# Patient Record
Sex: Female | Born: 1985
Health system: Southern US, Community
[De-identification: ages and names within clinical notes are randomized; demographics above are authoritative.]

## PROBLEM LIST (undated history)

## (undated) DIAGNOSIS — I491 Atrial premature depolarization: Secondary | ICD-10-CM

## (undated) DIAGNOSIS — E039 Hypothyroidism, unspecified: Secondary | ICD-10-CM

## (undated) DIAGNOSIS — R002 Palpitations: Secondary | ICD-10-CM

## (undated) HISTORY — DX: Hypothyroidism, unspecified: E03.9

## (undated) HISTORY — DX: Atrial premature depolarization: I49.1

## (undated) HISTORY — DX: Palpitations: R00.2

---

## 2004-01-07 ENCOUNTER — Emergency Department (HOSPITAL_COMMUNITY): Admission: EM | Admit: 2004-01-07 | Discharge: 2004-01-07 | Payer: Self-pay | Admitting: Emergency Medicine

## 2005-02-21 ENCOUNTER — Emergency Department (HOSPITAL_COMMUNITY): Admission: EM | Admit: 2005-02-21 | Discharge: 2005-02-22 | Payer: Self-pay | Admitting: Emergency Medicine

## 2008-03-28 ENCOUNTER — Inpatient Hospital Stay (HOSPITAL_COMMUNITY): Admission: AD | Admit: 2008-03-28 | Discharge: 2008-03-28 | Payer: Self-pay | Admitting: Obstetrics & Gynecology

## 2008-03-31 ENCOUNTER — Ambulatory Visit: Payer: Self-pay | Admitting: Obstetrics & Gynecology

## 2008-03-31 ENCOUNTER — Inpatient Hospital Stay (HOSPITAL_COMMUNITY): Admission: AD | Admit: 2008-03-31 | Discharge: 2008-04-03 | Payer: Self-pay | Admitting: Obstetrics

## 2008-04-02 ENCOUNTER — Encounter (INDEPENDENT_AMBULATORY_CARE_PROVIDER_SITE_OTHER): Payer: Self-pay | Admitting: Gastroenterology

## 2008-04-13 ENCOUNTER — Ambulatory Visit (HOSPITAL_COMMUNITY): Admission: RE | Admit: 2008-04-13 | Discharge: 2008-04-13 | Payer: Self-pay | Admitting: Obstetrics & Gynecology

## 2008-09-11 ENCOUNTER — Ambulatory Visit: Payer: Self-pay | Admitting: Obstetrics & Gynecology

## 2008-09-13 ENCOUNTER — Inpatient Hospital Stay (HOSPITAL_COMMUNITY): Admission: AD | Admit: 2008-09-13 | Discharge: 2008-09-17 | Payer: Self-pay | Admitting: Obstetrics and Gynecology

## 2008-09-13 ENCOUNTER — Ambulatory Visit: Payer: Self-pay | Admitting: Obstetrics & Gynecology

## 2008-09-14 ENCOUNTER — Encounter: Payer: Self-pay | Admitting: Obstetrics & Gynecology

## 2008-09-20 ENCOUNTER — Ambulatory Visit: Admission: RE | Admit: 2008-09-20 | Discharge: 2008-09-20 | Payer: Self-pay | Admitting: Obstetrics & Gynecology

## 2009-07-31 IMAGING — US US ABDOMEN COMPLETE
1 series · 14 of 25 positions shown · non-contrast
Comparison: None

CLINICAL DATA: Epigastric pain

COMPLETE ABDOMINAL ULTRASOUND
TECHNIQUE: Complete abdominal ultrasound examination was performed
including evaluation of the liver, gallbladder, bile ducts,
pancreas, kidneys, spleen, IVC, and abdominal aorta.

[Series 1: unknown · 0.34mm/px · 14 of 57 slices shown]
[im 1/57]
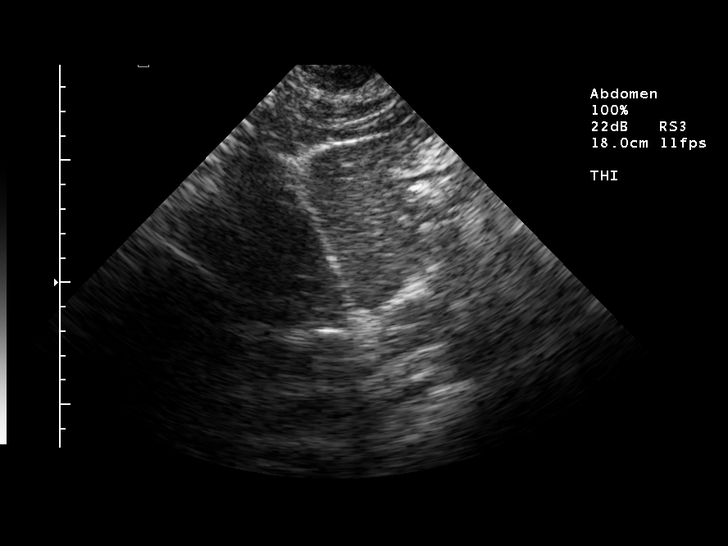
[im 5/57]
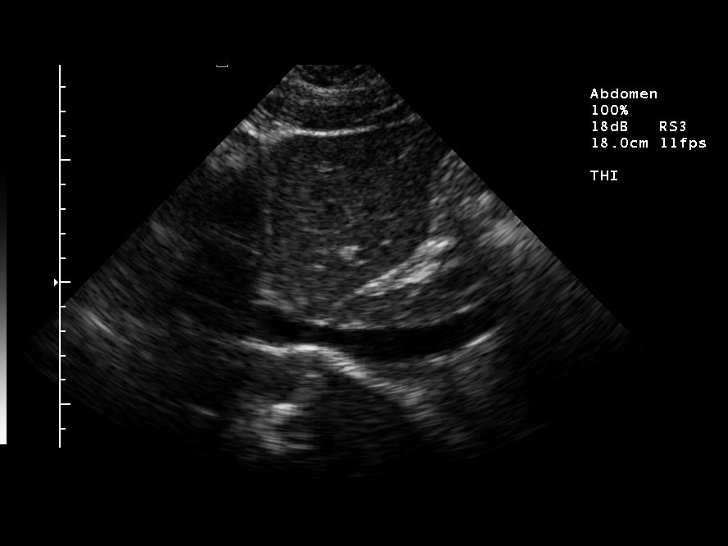
[im 10/57]
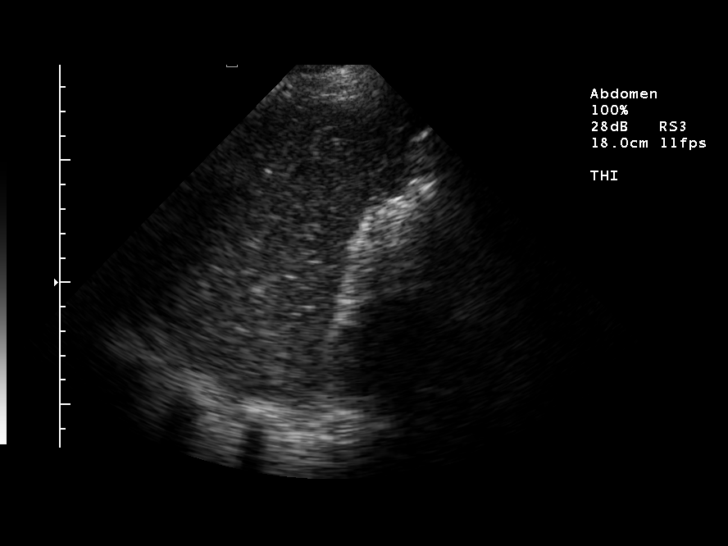
[im 15/57]
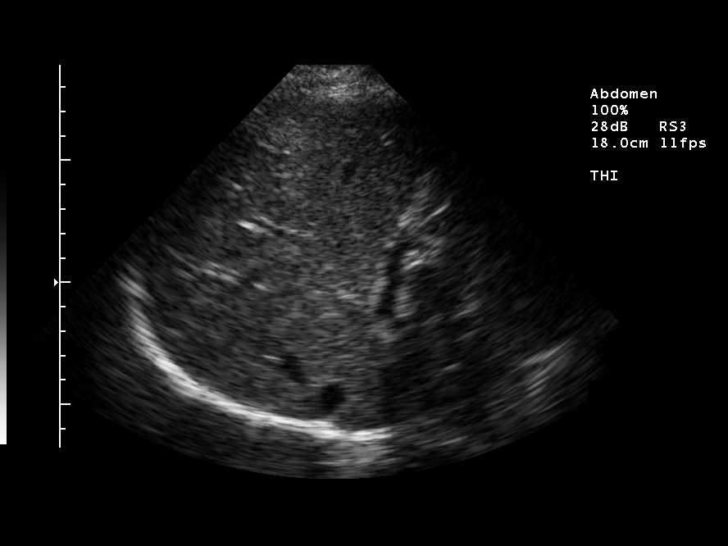
[im 19/57]
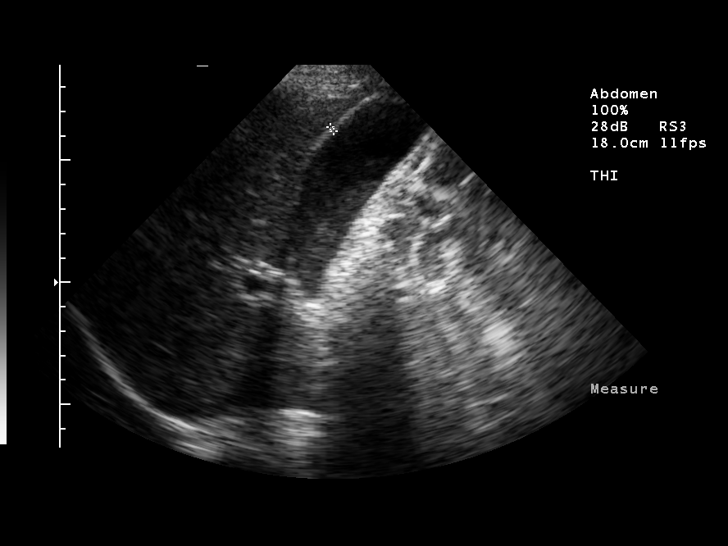
[im 22/57]
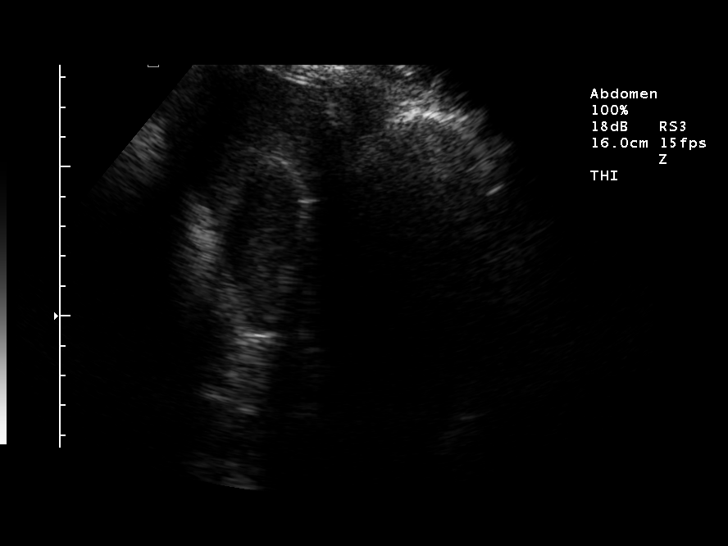
[im 26/57]
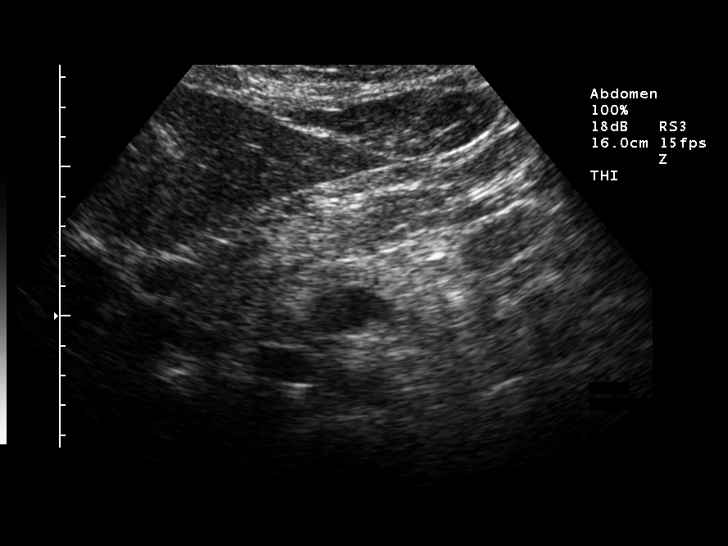
[im 31/57]
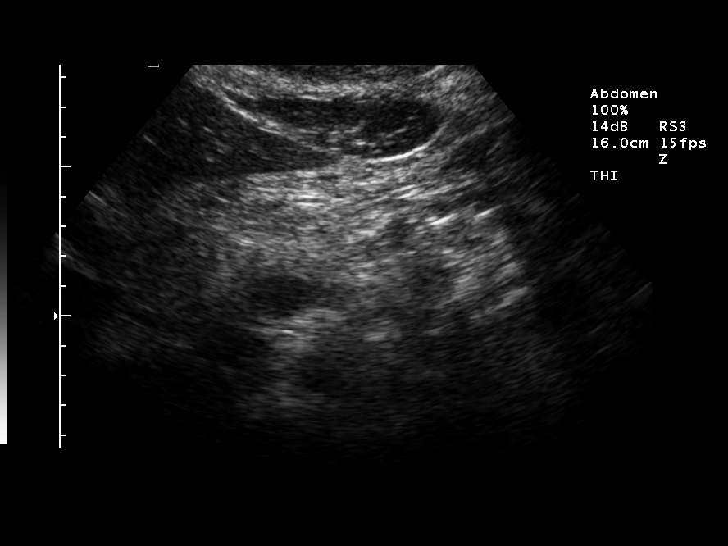
[im 36/57]
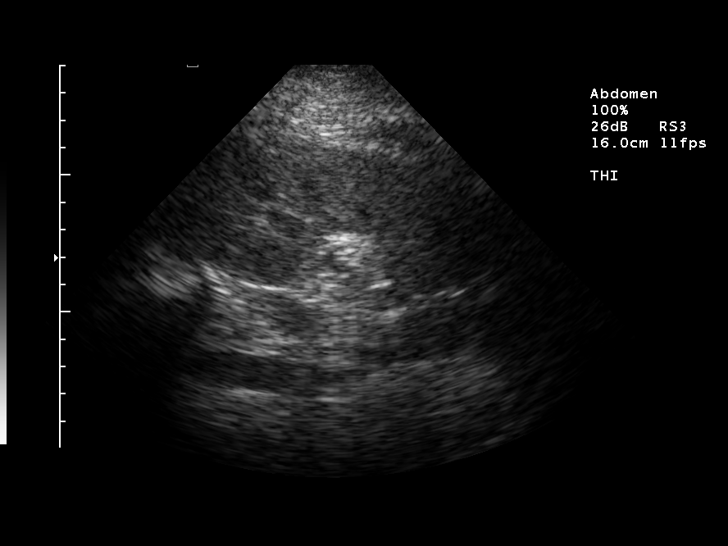
[im 38/57]
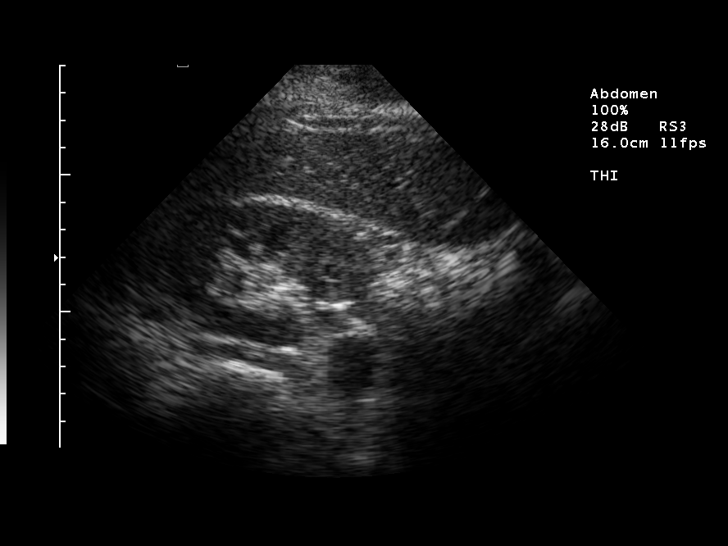
[im 43/57]
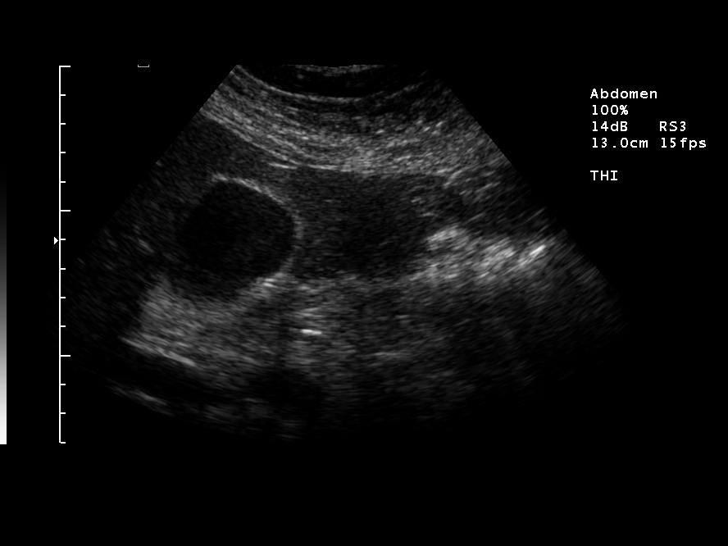
[im 47/57]
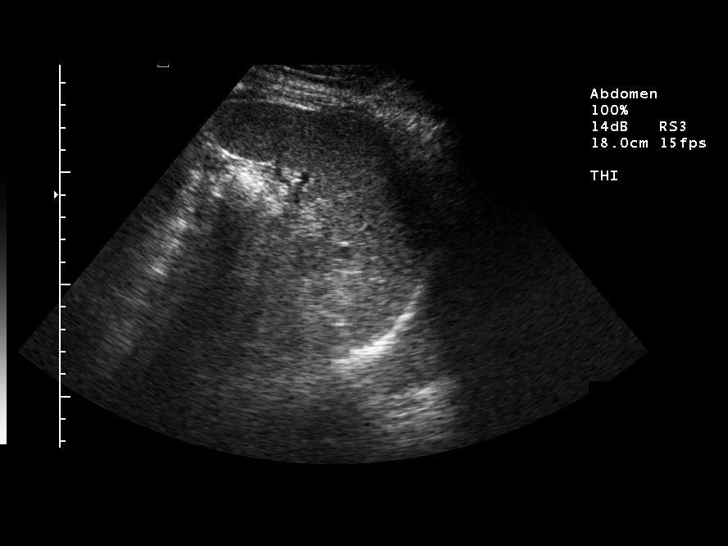
[im 52/57]
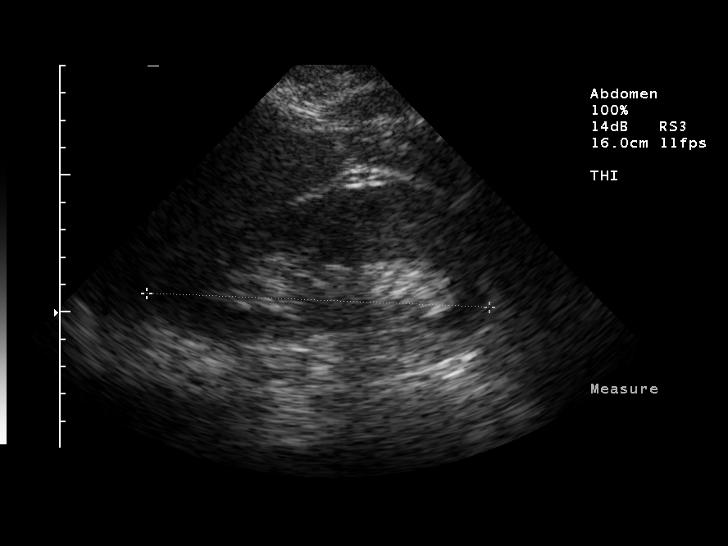
[im 57/57]
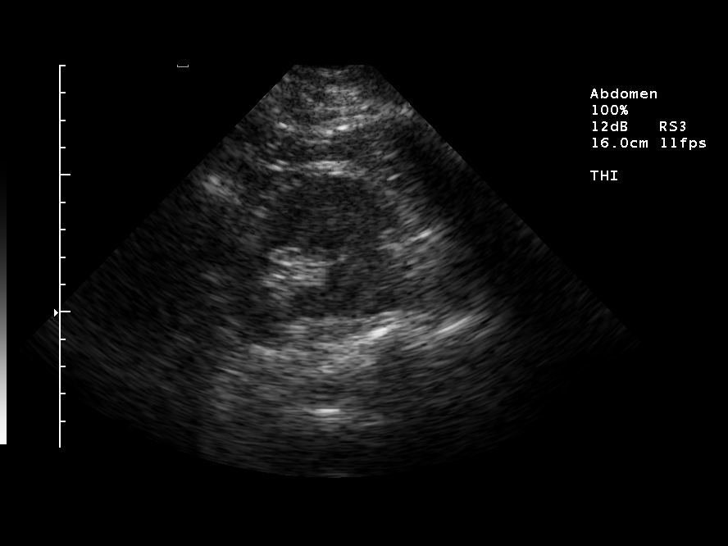

[14 of 25 positions shown; findings below may reference images not displayed]

FINDINGS: Gallbladder:  No gallstones, gallbladder wall thickening, or
pericholecystic fluid. Gallbladder sludge is present

Common bile duct: Within normal limits in caliber.

Liver:  No focal parenchymal abnormalities.  Within normal limits
in parenchymal echogenicity.

Inferior vena cava:  Visualized portion unremarkable.

Pancreas:  Visualized portion unremarkable. There is limited
visualization of the body and tail

Spleen:  Upper normal in size with a maximum diameter of 13.4 cm.

Right kidney:  Within normal limits in size and echogenicity. No
evidence of mass or hydronephrosis.

Left kidney:  Within normal limits in size and echogenicity. No
evidence of mass or hydronephrosis.

Abdominal aorta:  Within normal limits in caliber.
IMPRESSION: Gallbladder sludge without stones.  Limited visualization of the
pancreas.

## 2009-07-31 IMAGING — US US OB LIMITED
1 series · 14 of 26 positions shown · non-contrast
Comparison: none

CLINICAL DATA: Abdominal pain. 9 weeks pregnant by LMP

LIMITED OBSTETRIC ULTRASOUND
Number of Fetuses: 1
Heart Rate: 447bpm
Movement: Yes
Presentation: Breech
Placental Location: Anterior
Previa: No
Amniotic Fluid (Subjective): Normal
BPD: 3.7cm   17w   2d
MATERNAL FINDINGS:
Cervix: Closed/
Uterus/Adnexae: No abnormality noted.

[Series 1: unknown · 0.33mm/px · 14 of 26 slices shown]
[im 1/26]
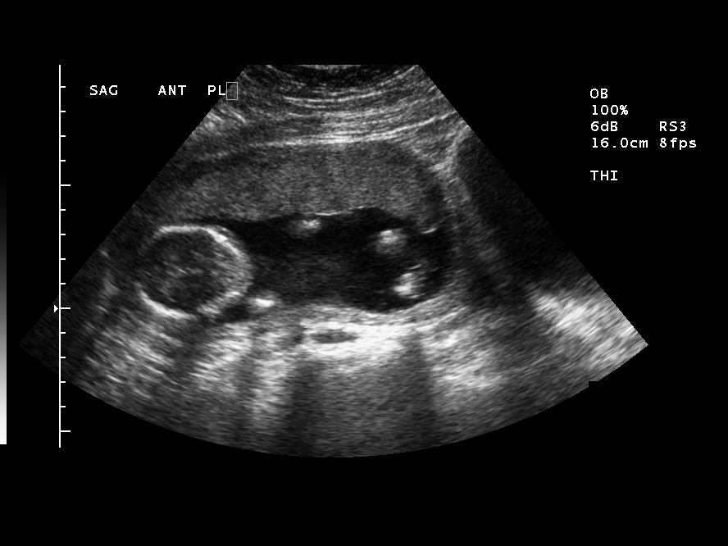
[im 3/26]
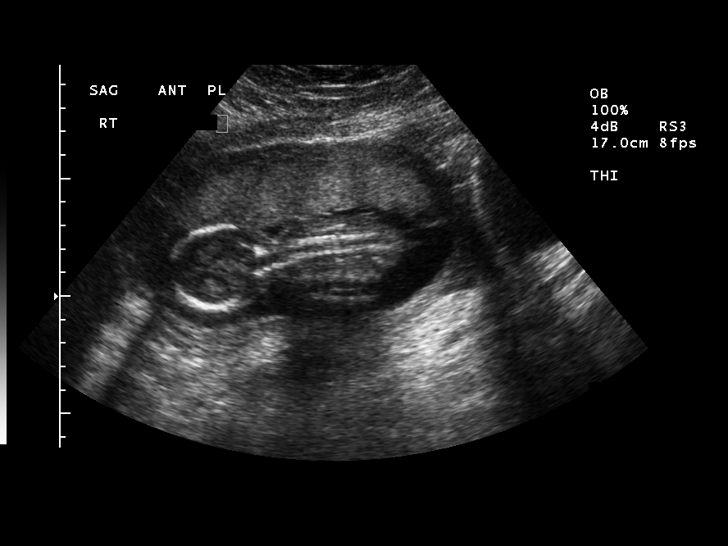
[im 5/26]
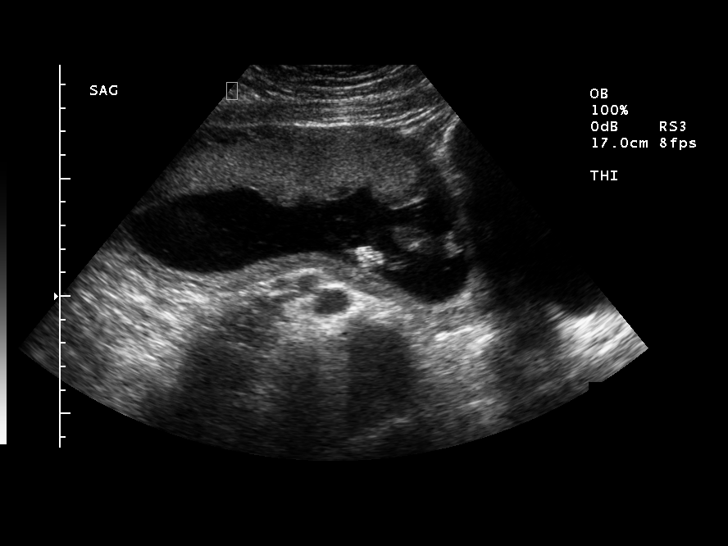
[im 7/26]
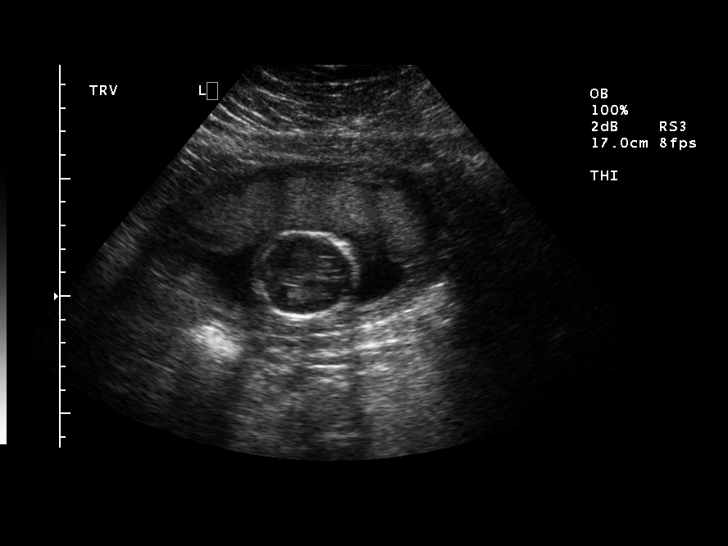
[im 9/26]
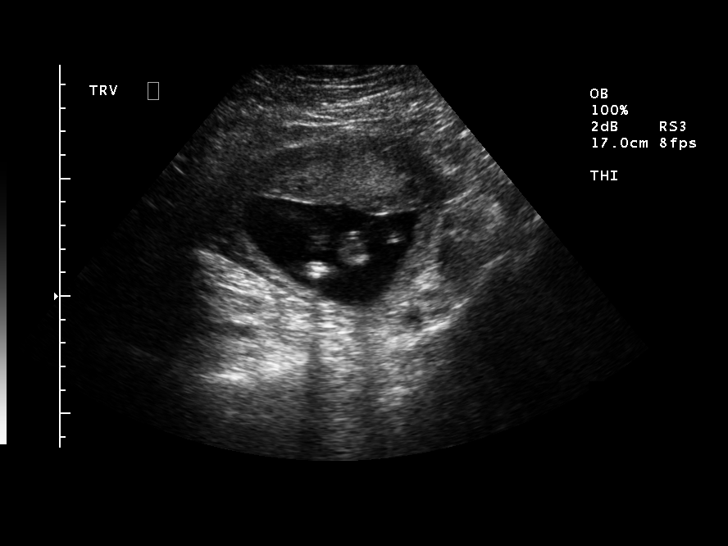
[im 11/26]
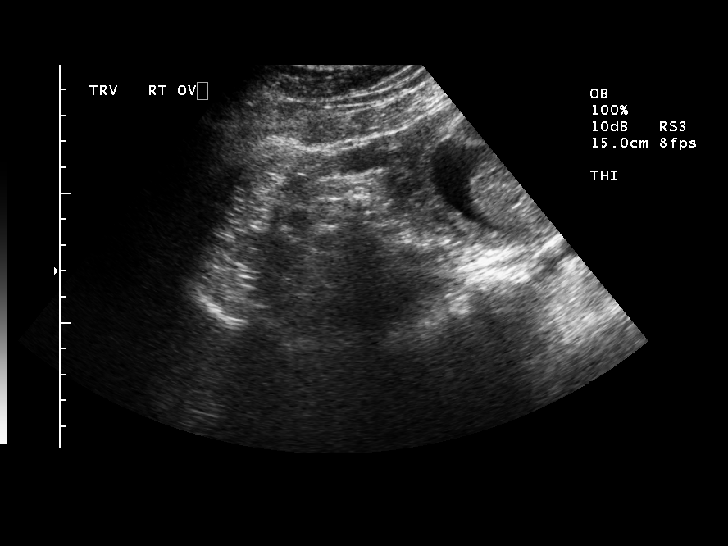
[im 13/26]
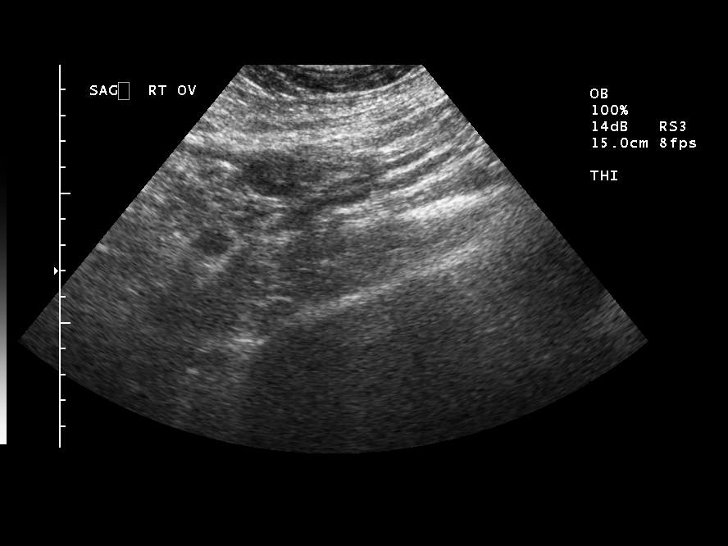
[im 14/26]
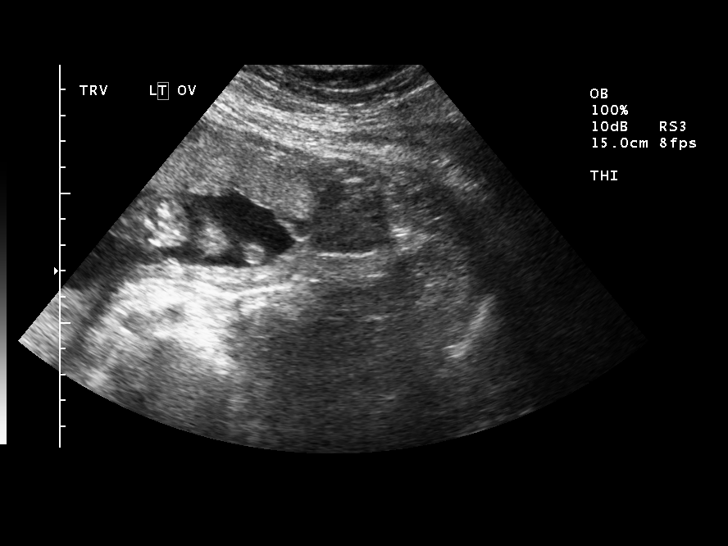
[im 16/26]
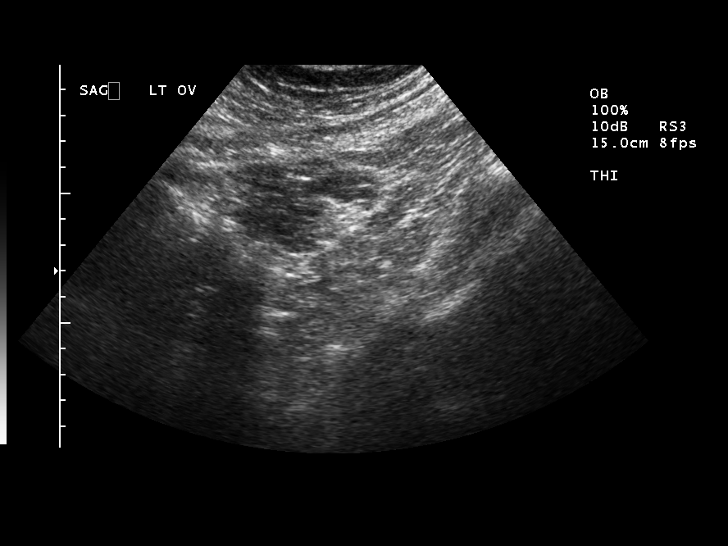
[im 18/26]
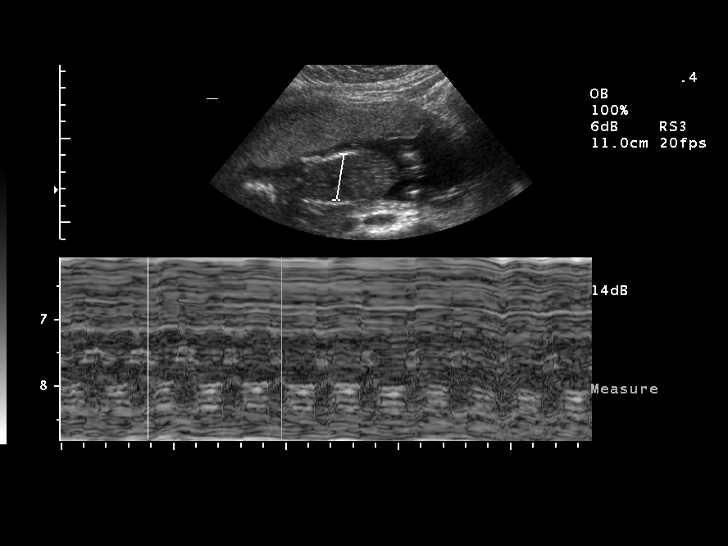
[im 20/26]
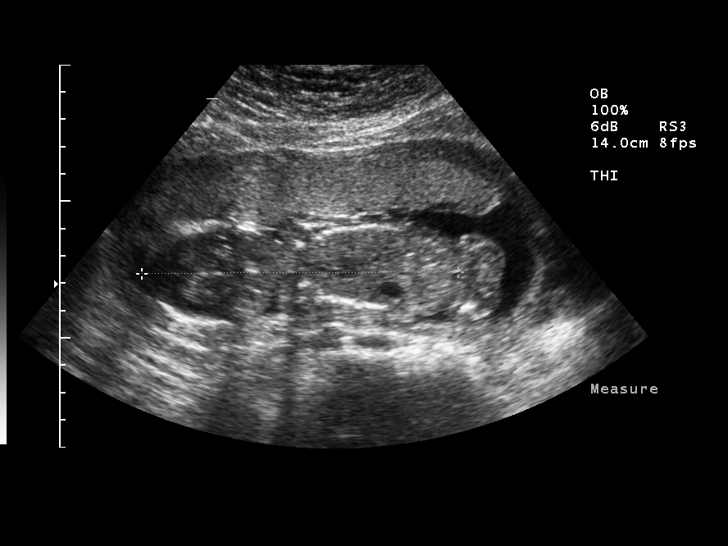
[im 22/26]
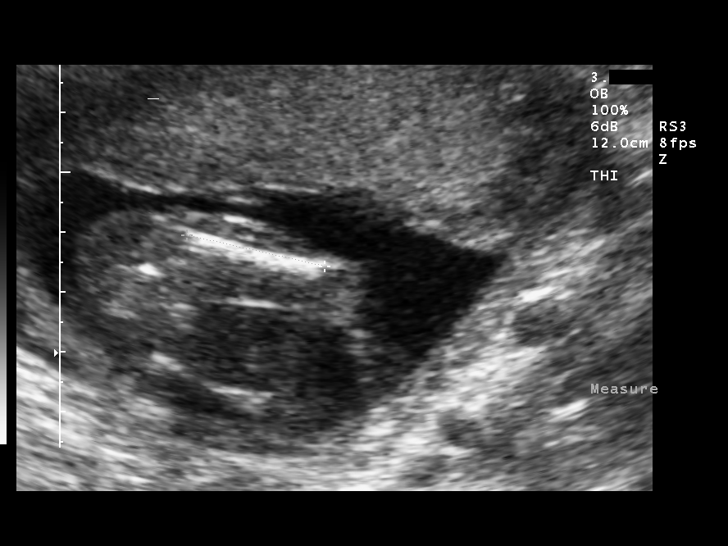
[im 24/26]
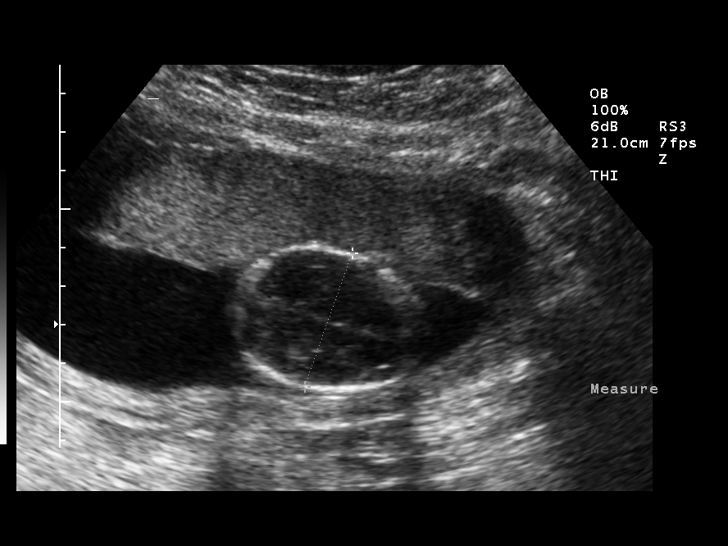
[im 26/26]
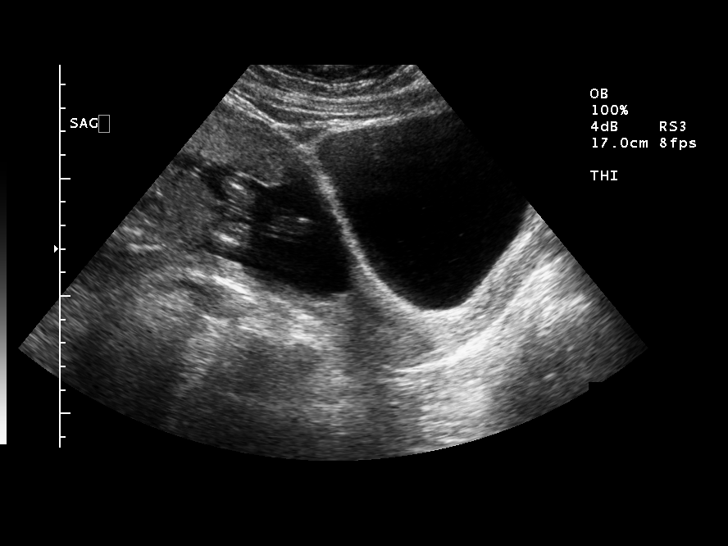

[14 of 26 positions shown; findings below may reference images not displayed]

IMPRESSION: Single living intrauterine fetus at approximately 17 weeks
gestational age.  LMP is inaccurate.  Complete OB ultrasound is
recommended for more accurate dating and fetal anatomic evaluation,
and can be performed at [REDACTED].

## 2009-08-23 ENCOUNTER — Emergency Department (HOSPITAL_COMMUNITY): Admission: EM | Admit: 2009-08-23 | Discharge: 2009-08-23 | Payer: Self-pay | Admitting: Emergency Medicine

## 2009-11-11 ENCOUNTER — Encounter: Admission: RE | Admit: 2009-11-11 | Discharge: 2009-11-11 | Payer: Self-pay | Admitting: Physician Assistant

## 2010-11-09 ENCOUNTER — Encounter: Payer: Self-pay | Admitting: Emergency Medicine

## 2010-12-15 ENCOUNTER — Inpatient Hospital Stay (INDEPENDENT_AMBULATORY_CARE_PROVIDER_SITE_OTHER)
Admission: RE | Admit: 2010-12-15 | Discharge: 2010-12-15 | Disposition: A | Discharge status: Home / Self Care | Payer: 59 | Source: Ambulatory Visit | Attending: Family Medicine | Admitting: Family Medicine

## 2010-12-15 DIAGNOSIS — J069 Acute upper respiratory infection, unspecified: Secondary | ICD-10-CM

## 2011-03-03 NOTE — Op Note (Signed)
NAMELUISE, YAMAMOTO               ACCOUNT NO.:  [id number]   MEDICAL RECORD NO.:  [id number]          PATIENT TYPE:  INP   LOCATION:  9147                          FACILITY:  [facility]   PHYSICIAN:  Lesly Dukes, M.D. DATE OF BIRTH:  [birth year]   DATE OF PROCEDURE:  DATE OF DISCHARGE:                               OPERATIVE REPORT   PREOPERATIVE DIAGNOSES:  1. Term intrauterine pregnancy.  2. Nonreassuring fetal heart tracing.   POSTOPERATIVE DIAGNOSES:  1. Term intrauterine pregnancy.  2. Nonreassuring fetal heart tracing.   PROCEDURE:  A primary low transverse cesarean section.   SURGEON:  Lesly Dukes, MD   ASSISTANT:  Odie Sera, DO   ANESTHESIA:  Epidural.   INDICATIONS FOR SURGERY:  Ms. Jaeleah Smyser is a 25 year old gravida 2,  para 0-0-0 admitted at 41-1/7th weeks' gestational age in active labor.  Her labor progressed without complications until second stage of labor.  After the patient dilated completely, she began pushing efforts.  With  the pushing efforts, the fetal heart tracing dropped down to the 80s for  several minutes at that time.  Fetal heart tracing recovered; however,  with the next pushing efforts, fetal heart tracing dropped down for  several minutes.  Because of nonreassured heart tracing and the baby's  inability to tolerate any pushing efforts at all, the patient was  consented on the risks and benefits of primary cesarean section to  include but not limited to bleeding, infection, and damage to internal  organs.  The patient's questions were answered.  She voiced  understanding of the risks and agreed to proceed with a C-section.   DESCRIPTION OF PROCEDURE:  The patient was seen in the operating room.  The patient had epidural in place which was redosed.  The patient was  prepped and draped in the usual sterile manner.  Time-out was conducted.  Appropriate anesthesia was confirmed.  A low-transverse Pfannenstiel  incision was made in the  skin with the scalpel and extended down to the  fascial layer.  The fascia was incised in the midline and the fascial  incision was extended laterally with the Mayo scissors.  The fascia was  then bluntly and sharply dissected off the underlying fascial muscles.  The rectus muscles were then separated in the midline manually.  The  peritoneum was entered with a hemostat and the peritoneal opening was  extended first with manual traction and then partially with  electrocautery.  An appropriate opening to the uterus was obtained and  the bladder blade was placed.  A transverse incision was made in the  lower uterine segment with the scalpel through the layers of myometrium  carefully until bulging membranes were noted.  The membranes were  ruptured manually and the uterine incision was extended laterally and  superiorly with manual traction.  The infant's head was grasped with  assistance of upper pressure through the vagina by the delivery nurse.  The bladder blade was removed and the head was delivered with assistance  of fundal pressure.  The mouth and nares were bulb suctioned.  Then, the  shoulders  were delivered and followed by rest of the corpus in the usual  manner.  The infant was bulb suctioned again and the cord was clamped  and cut and the infant was handed to the awaiting acute NICU team.  However, the placenta then delivered spontaneously with the assistance  of uterine massage and the placenta was intact.  Cord blood gas was  collected and it was 7.34.  Uterine atony was noted for several minutes  and the patient was given 1000 mcg of Cytotec per rectum as well as  Methergine 0.2 mg IM.  In addition, this was all in addition to the  Pitocin running IV.  Uterine tone improved and uterine incision was  closed using 0 Vicryl and a running interlocking fashion.  Good  hemostasis was noted upon closure of the uterine incision.  The abdomen  was then irrigated with sterile water.   The fascia was then closed using  0 Vicryl in a running noninterlocking fashion.  Good hemostasis was  noted.  There were no defects noted.  Because of greater than 2 cm of  subcutaneous fat, the subcutaneous layers were approximated using plain  suture with 3 vertical mattress sutures.  The skin was then closed with  staples in the usual manner.  Pressure dressing was applied.   FINDINGS:  1. Clear amniotic fluid.  2. Viable female infant.   SPECIMENS:  Placenta.   DISPOSITION:  The patient was taken to PACU in good condition.   ESTIMATED BLOOD LOSS:  800 mL.   COMPLICATIONS:  None immediate.      Odie Sera, DO  Electronically Signed     ______________________________  Lesly Dukes, M.D.    MC/MEDQ  D:  09/14/2008  T:  09/14/2008  Job:  [id number]

## 2011-03-03 NOTE — Consult Note (Signed)
NAME:  Theresa Anderson, EIMER               ACCOUNT NO.:  [id number]   MEDICAL RECORD NO.:  [id number]         PATIENT TYPE:  LAMB   LOCATION:                               FACILITY:  [facility]   PHYSICIAN:  Graylin Shiver, M.D.        DATE OF BIRTH:   DATE OF CONSULTATION:  04/02/2008  DATE OF DISCHARGE:                                 CONSULTATION   REASON FOR CONSULTATION:  The patient is a 25 year old female who is [redacted]  weeks pregnant.  She was admitted to [facility] because of a 5-day  history of severe epigastric abdominal pain and odynophagia with  complaints of severe pain when she swallowed.  The pain was severe  enough that she was unable to eat or drink.  She was admitted with some  dehydration.  We were asked to see the patient in consultation, and I  spoke to her physician this morning, and, given these above symptoms, it  was felt most prudent that we proceed with an endoscopy.  I, therefore,  had the patient transferred over to [facility] where I saw the  patient in consultation with thoughts of proceeding directly with  endoscopy which was not available at [facility].  The patient  gives no history of peptic ulcer disease   PAST HISTORY:   ALLERGIES:  AMOXICILLIN.   PRIOR SURGERIES:  None.   MEDICAL PROBLEMS:  None.   SYSTEMS REVIEW:  Negative except for above physical.   PHYSICAL EXAMINATION:  VITAL SIGNS:  Stable.  GENERAL:  She is no distress.  Nonicteric.  HEART:  Regular rhythm.  No murmurs.  LUNGS:  Clear.  ABDOMEN:  Soft.  Mild tenderness in the epigastric area.  No  hepatosplenomegaly.   IMPRESSION:  1. Epigastric pain.  2. Odynophagia.   PLAN:  Will proceed with EGD to further investigate.           ______________________________  Graylin Shiver, M.D.    SFG/MEDQ  D:  04/02/2008  T:  04/02/2008  Job:  [id number]   cc:   Allie Bossier, MD

## 2011-03-03 NOTE — Discharge Summary (Signed)
NAMECARLETHA, Theresa Anderson               ACCOUNT NO.:  [id number]   MEDICAL RECORD NO.:  [id number]          PATIENT TYPE:  INP   LOCATION:  9147                          [facility]ACILITY[facility]   PHYSICIAN:  Lesly Dukes, M.D. DATE OF BIRTH:  1986-07-30   DATE OF ADMISSION:  09/13/2008  DATE OF DISCHARGE:  09/17/2008                               DISCHARGE SUMMARY   REASON FOR ADMISSION:  Pregnancy at term.   The patient was admitted for high blood pressure and labor.  Her blood  pressures at that time were 130s/80s.  She progressed slowly, and she  made it to approximately complete, but was unable to push.  The fetus at  that time was having variable decelerations and it felt like that it was  necessary at that time to do a cesarean section for fetal well being.   PREOPERATIVE DIAGNOSES:  Low transverse cesarean section, failed labor  for nonreassuring fetal heart rate.   POSTOPERATIVE DIAGNOSES:  Low transverse cesarean section, failed labor  for nonreassuring fetal heart rate.   SURGEON:  Lesly Dukes, MD   ASSISTANT:  Nolene Bernheim, MD   ESTIMATED BLOOD LOSS:  During the procedure was approximately 800 mL.   The patient's postop course has been essentially uneventful.  She is up,  ambulating without any problems.  Taking p.o. fluids and solids well.   PHYSICAL EXAMINATION:  HEART:  Today, regular to rhythm and rate.  LUNGS:  Clear to auscultation bilaterally.  ABDOMEN:  Soft.  Bowel sounds are present in all 4 quadrants.  Fundus is  firm at negative 2U.Her incision is dry and intact.  There is no  redness, drainage, or swelling.  EXTREMITIES:  There is trace edema in the lower extremities.   She can be discharged to home today.   DISCHARGE MEDICATIONS:  1. Prenatal vitamins b.i.d.  2. Colace 100 mg b.i.d.  3. Motrin 600 mg p.o. q.6 h. P.r.n., mild pain.  4. Percocet 5/325 mg p.o., moderate-to-severe pain.   FOLLOWUP:  [facility],  at that time she is going to have an IUD placed, and the baby love home  visit on postop day 5 to remove staples.      Zerita Boers, N.M.      Lesly Dukes, M.D.  Electronically Signed    DL/MEDQ  D:  16/07/9603  T:  09/17/2008  Job:  [id number]   cc:   Lesly Dukes, M.D.

## 2011-03-03 NOTE — Op Note (Signed)
NAME:  Theresa Anderson, Theresa Anderson NO.:  [id number]   MEDICAL RECORD NO.:  [id number]          PATIENT TYPE:  AMB   LOCATION:  ENDO                         FACILITY:  [facility]   PHYSICIAN:  Graylin Shiver, M.D.   DATE OF BIRTH:  Feb 15, 1986   DATE OF PROCEDURE:  04/02/2008  DATE OF DISCHARGE:                               OPERATIVE REPORT   PROCEDURE:  Esophagogastroduodenoscopy with biopsy.   INDICATIONS FOR PROCEDURE:  Epigastric pain, odynophagia.   Informed consent was obtained after explanation of the risks of  bleeding, infection and perforation.   PROCEDURE:  With patient in the left lateral decubitus position, the  Pentax gastroscope was inserted into the oropharynx and passed into the  esophagus.  It was advanced down the esophagus then into the stomach and  into the duodenum.  The second portion and bulb of the duodenum looked  normal.  The stomach looked normal in its entirety including the upper  fundus and cardia seen on retroflexion.  In the distal 5-6 cm of the  esophagus, the mucosa was inflamed, erosive and ulcerated.  Some ulcers  were small ulcers, others were a little larger.  These were photographed  and some sampling from biopsies from these ulcers was obtained for  histological inspection.  The mid and proximal esophagus looked normal.  She tolerated this procedure well without complications.   IMPRESSION:  Distal erosive ulcerative esophagitis.   PLAN:  Would recommend Protonix 40 mg IV b.i.d. for now.  We will check  the biopsies for histological evaluation.           ______________________________  Graylin Shiver, M.D.     SFG/MEDQ  D:  04/02/2008  T:  04/02/2008  Job:  [id number]   cc:   Allie Bossier, MD

## 2011-03-03 NOTE — Discharge Summary (Signed)
NAME:  Theresa Anderson, MEINERS NO.:  [id number]   MEDICAL RECORD NO.:  [id number]          PATIENT TYPE:  INP   LOCATION:  9305                          FACILITY:  [facility]   PHYSICIAN:  Tanya S. Shawnie Pons, M.D.   DATE OF BIRTH:  14-Nov-1985   DATE OF ADMISSION:  03/31/2008  DATE OF DISCHARGE:  04/03/2008                               DISCHARGE SUMMARY   ADMISSION DIAGNOSES:  1. Intrauterine pregnancy at 17 weeks 2 days' gestation.  2. Gastritis.  3. Hyperemesis.  4. Dehydration.   DISCHARGE DIAGNOSES:  1. Intrauterine pregnancy at 17 weeks 5 days' gestation.  2. Erosive esophagitis.  3. Dehydration, resolved.   PROCEDURES:  1. The patient had an obstetrical ultrasound performed on March 31, 2008, showing a single gestation, breech presentation, placenta was      anterior above the cervical os measured 17 weeks 2 days' gestation.  2. The patient had an abdominal ultrasound showing no gallstones,      gallbladder wall thickening or pericholecystic fluid.  Gallbladder      sludge was present.  There was no other abnormalities noted though      there was limited visualization of the pancreas.  3. The patient had an esophagogastroduodenoscopy with biopsy showing      distal erosive/ulcerative esophagitis.  Biopsies were performed      during the procedure by Dr. Graylin Shiver.   COMPLICATIONS:  None.   CONSULTATIONS:  Gastroenterology with Graylin Shiver, MD.   PERTINENT LABORATORY FINDINGS:  Upon admission, she had hepatic function  panel grossly normal with a total bilirubin of 0.9, direct 0.2, indirect  0.7, AST 25, ALT 33, lipase was 19.  Beta HCG was 19,317.  Hemoglobin  11.2, hematocrit 33, sodium 141, potassium 4.1, glucose 78, BUN 3, and  creatinine 0.9.  Urinalysis greater than 80 ketones and trace leukocyte  esterase, amylase was 52, and rapid HIV was nonreactive.  Typing screen  showed blood type B positive, negative antibody screen, H. pylori  antibody IgG  was negative.  RPR was nonreactive, hepatitis B surface  antigen was negative, and rubella was immune.   BRIEF PERTINENT ADMISSION HISTORY:  This is a 25 year old gravida 1,  para 0 at 32 weeks 2 days' gestation who presented to the emergency room  at [facility] with 4 days of increasing epigastric pain and burning.  She was found to be dehydrated.  She was initially worked up with lab  showing that she had an intrauterine pregnancy.  Ultrasound was  performed confirming 17 weeks' gestation.  Ultrasound was also performed  for evaluation of the gallbladder and abdomen, showing positive sludge,  but no stones, or gallbladder wall thickening.  Labs were grossly  normal.  She was transferred to [facility] due to her pregnancy.   HOSPITAL COURSE:  The patient was accepted and admitted at [facility] at which time she was treated for epigastric pain with a GI  cocktail x2.  She was also started on Protonix IV 40 mg daily.  Due to  the nature of her pain, and  the fact that she could not take anything by  mouth secondary to severe pain, Gastroenterology consult was performed.  The patient had no relief with the GI cocktail or the Protonix.  Upon  evaluation by the gastroenterologist, Dr. Evette Cristal, the patient was taken  for upper endoscopy.  On the esophagogastroduodenoscopy with biopsy, the  patient was noted to have severe distal erosive/ulcerative esophagitis.  Her management was switched to include viscous lidocaine 5 mL with meals  and Protonix IV 40 mg b.i.d.  The patient slowly began to tolerate oral  liquids.  Her diet was advanced slowly and she was tolerating some  solids.  Her labs were noted as stated above with no distinct  abnormality noted on laboratory.  On the day of discharge, the patient  was feeling much better with the viscous lidocaine.  She was tolerating  more p.o. and making good amount of urine.  Physical examination was  grossly normal.  She just had some  mild tenderness at the epigastric  region.  Vitals were stable.  She is to be discharged home in stable  condition.   DISCHARGE STATUS:  Stable.   DISCHARGE MEDICATIONS:  1. Prenatal vitamins 1 tablet p.o. daily, patient to get on over-the-      counter basis.  2. Viscous lidocaine 2% 5 mL by mouth 3 times daily with meals.  3. Protonix 40 mg p.o. b.i.d. x7 days and once by mouth daily.  4. Ambien 10 mg 1 p.o. nightly x5 days, then stop.   DISCHARGE INSTRUCTIONS:  1. Discharge to home.  2. Regular activity.  3. Diet as tolerated.  4. The patient is to follow up with [facility]      for her prenatal care.      Theresa Lemon, MD  Electronically Signed     ______________________________  Shelbie Proctor. Shawnie Pons, M.D.    NS/MEDQ  D:  04/03/2008  T:  04/04/2008  Job:  [id number]

## 2011-03-19 ENCOUNTER — Other Ambulatory Visit (HOSPITAL_COMMUNITY)
Admission: RE | Admit: 2011-03-19 | Discharge: 2011-03-19 | Disposition: A | Discharge status: Home / Self Care | Payer: 59 | Source: Ambulatory Visit | Attending: Family Medicine | Admitting: Family Medicine

## 2011-03-19 ENCOUNTER — Other Ambulatory Visit: Payer: Self-pay | Admitting: Physician Assistant

## 2011-03-19 DIAGNOSIS — Z01419 Encounter for gynecological examination (general) (routine) without abnormal findings: Secondary | ICD-10-CM | POA: Insufficient documentation

## 2011-03-19 DIAGNOSIS — Z113 Encounter for screening for infections with a predominantly sexual mode of transmission: Secondary | ICD-10-CM | POA: Insufficient documentation

## 2011-03-23 ENCOUNTER — Encounter: Payer: 59 | Admitting: Obstetrics & Gynecology

## 2011-07-16 LAB — BASIC METABOLIC PANEL
BUN: 4 — ABNORMAL LOW
CO2: 23
Calcium: 9.1
Creatinine, Ser: 0.71
Glucose, Bld: 85

## 2011-07-16 LAB — URINALYSIS, ROUTINE W REFLEX MICROSCOPIC
Glucose, UA: NEGATIVE
Glucose, UA: NEGATIVE
Hgb urine dipstick: NEGATIVE
Ketones, ur: >80 — AB
Ketones, ur: >80 — AB
Nitrite: NEGATIVE
Protein, ur: NEGATIVE
Specific Gravity, Urine: >1.03 — ABNORMAL HIGH
pH: 6.5

## 2011-07-16 LAB — CBC
MCHC: 34.9
MCV: 83
Platelets: 288
Platelets: 233
RDW: 14.4
WBC: 5.6

## 2011-07-16 LAB — URINALYSIS, DIPSTICK ONLY
Bilirubin Urine: NEGATIVE
Glucose, UA: NEGATIVE
Hgb urine dipstick: NEGATIVE
Ketones, ur: >80 — AB
Leukocytes, UA: NEGATIVE
Nitrite: NEGATIVE
Protein, ur: NEGATIVE
Specific Gravity, Urine: 1.02
Urobilinogen, UA: 0.2
pH: 7

## 2011-07-16 LAB — TYPE AND SCREEN: Antibody Screen: NEGATIVE

## 2011-07-16 LAB — DIFFERENTIAL
Basophils Relative: 0
Eosinophils Absolute: 0.2
Eosinophils Relative: 4
Lymphocytes Relative: 27
Lymphs Abs: 1.5
Neutro Abs: 3.2

## 2011-07-16 LAB — POCT I-STAT, CHEM 8
Calcium, Ion: 1.12
Chloride: 106
Glucose, Bld: 78
HCT: 33 — ABNORMAL LOW
TCO2: 23

## 2011-07-16 LAB — HEPATIC FUNCTION PANEL
ALT: 33
Albumin: 2.9 — ABNORMAL LOW
Alkaline Phosphatase: 106
Bilirubin, Direct: 0.2
Total Protein: 7

## 2011-07-16 LAB — URINE MICROSCOPIC-ADD ON

## 2011-07-16 LAB — WET PREP, GENITAL: Yeast Wet Prep HPF POC: NONE SEEN

## 2011-07-16 LAB — POCT PREGNANCY, URINE: Operator id: 275371

## 2011-07-16 LAB — AMYLASE: Amylase: 52

## 2011-07-16 LAB — GC/CHLAMYDIA PROBE AMP, GENITAL: Chlamydia, DNA Probe: NEGATIVE

## 2011-07-16 LAB — RUBELLA SCREEN: Rubella: >500 — ABNORMAL HIGH

## 2011-07-16 LAB — ABO/RH: ABO/RH(D): B POS

## 2011-07-16 LAB — HCG, QUANTITATIVE, PREGNANCY: hCG, Beta Chain, Quant, S: 19317 — ABNORMAL HIGH

## 2011-07-21 LAB — COMPREHENSIVE METABOLIC PANEL
BUN: 6 mg/dL (ref 6–23)
CO2: 22 mEq/L (ref 19–32)
Calcium: 8.8 mg/dL (ref 8.4–10.5)
Creatinine, Ser: 0.73 mg/dL (ref 0.4–1.2)
GFR calc non Af Amer: >60 mL/min (ref >60)
Glucose, Bld: 98 mg/dL (ref 70–99)
Sodium: 136 mEq/L (ref 135–145)
Total Protein: 6.4 g/dL (ref 6.0–8.3)

## 2011-07-21 LAB — CBC
Hemoglobin: 12.1 g/dL (ref 12.0–15.0)
Hemoglobin: 9.3 g/dL — ABNORMAL LOW (ref 12.0–15.0)
MCHC: 33.8 g/dL (ref 30.0–36.0)
MCHC: 34.9 g/dL (ref 30.0–36.0)
MCV: 86.1 fL (ref 78.0–100.0)
RBC: 3.07 MIL/uL — ABNORMAL LOW (ref 3.87–5.11)
RBC: 4.17 MIL/uL (ref 3.87–5.11)
RDW: 15.1 % (ref 11.5–15.5)
WBC: 14.8 10*3/uL — ABNORMAL HIGH (ref 4.0–10.5)

## 2011-07-21 LAB — URINALYSIS, ROUTINE W REFLEX MICROSCOPIC
Bilirubin Urine: NEGATIVE
Ketones, ur: NEGATIVE mg/dL
Nitrite: NEGATIVE
Specific Gravity, Urine: <1.005 — ABNORMAL LOW (ref 1.005–1.030)
Urobilinogen, UA: 1 mg/dL (ref 0.0–1.0)
pH: 6.5 (ref 5.0–8.0)

## 2011-07-21 LAB — URINE MICROSCOPIC-ADD ON

## 2011-07-21 LAB — LACTATE DEHYDROGENASE: LDH: 152 U/L (ref 94–250)

## 2011-07-21 LAB — RPR: RPR Ser Ql: NONREACTIVE

## 2011-07-21 LAB — URIC ACID: Uric Acid, Serum: 4.3 mg/dL (ref 2.4–7.0)

## 2011-07-23 LAB — AFP SCREEN CLINICAL RESULTS
AFP MoM: 0.78
AFP: 21.1
HCG, Total: 16178
uE3 Mom: 0.99
uE3 Value: 2.83

## 2011-07-23 LAB — RISK ASSESSMENT (MID TRIMESTER)
Equivalent Age Risk: <15
Osb Risk: <1:27300 {titer}
Trisomy 18 (Edward) Syndrome Interp.: 1:66000 {titer}

## 2011-07-23 LAB — CLINICAL INFORMATION
Gest Age-Collect: 17.3
Maternal Wt: 223
Specimen Number: 1
Ultrasound Date: NO GROWTH

## 2011-09-20 ENCOUNTER — Encounter: Payer: Self-pay | Admitting: *Deleted

## 2011-09-20 ENCOUNTER — Emergency Department (INDEPENDENT_AMBULATORY_CARE_PROVIDER_SITE_OTHER)
Admission: EM | Admit: 2011-09-20 | Discharge: 2011-09-20 | Disposition: A | Discharge status: Home / Self Care | Payer: 59 | Source: Home / Self Care

## 2011-09-20 DIAGNOSIS — J111 Influenza due to unidentified influenza virus with other respiratory manifestations: Secondary | ICD-10-CM

## 2011-09-20 MED ORDER — OSELTAMIVIR PHOSPHATE 75 MG PO CAPS: 75.0000 mg | ORAL_CAPSULE | Freq: Two times a day (BID) | ORAL | Status: AC

## 2011-09-20 NOTE — ED Provider Notes (Signed)
History     CSN: [id number] Arrival date & time: 09/20/2011 10:52 AM   None     Chief Complaint  Patient presents with  . Sore Throat    pt with onset of symptoms friday evening fever last night 102 taking otc meds without relief   . Nasal Congestion  . Cough  . Fever    (Consider location/radiation/quality/duration/timing/severity/associated sxs/prior treatment) HPI Comments: Abrupt onset symptoms afternoon of 09/18/11.  Felt fine that morning.  Now with high fever (tmax 103), sore throat, cough, nasal congestion, body aches. Did not get flu shot this year.   Patient is a 25 y.o. female presenting with fever. The history is provided by the patient.  Fever Primary symptoms of the febrile illness include fever, fatigue, cough and myalgias. Primary symptoms do not include wheezing, shortness of breath, nausea, vomiting, diarrhea or rash. The current episode started 2 days ago. This is a new problem. The problem has not changed since onset.   History reviewed. No pertinent past medical history.  Past Surgical History  Procedure Date  . Cesarean section     Family History  Problem Relation Age of Onset  . Diabetes Father   . Heart failure Father     History  Substance Use Topics  . Smoking status: Never Smoker   . Smokeless tobacco: Not on file  . Alcohol Use: Yes    OB History    Grav Para Term Preterm Abortions TAB SAB Ect Mult Living                  Review of Systems  Constitutional: Positive for fever, chills and fatigue.  HENT: Positive for congestion, sore throat, rhinorrhea and postnasal drip. Negative for neck stiffness.   Respiratory: Positive for cough. Negative for shortness of breath and wheezing.   Gastrointestinal: Negative for nausea, vomiting and diarrhea.  Musculoskeletal: Positive for myalgias.  Skin: Negative for rash.    Allergies  Amoxicillin  Home Medications   Current Outpatient Rx  Name Route Sig Dispense Refill  . IBUPROFEN 400  MG PO TABS Oral Take 800 mg by mouth every 6 (six) hours as needed.      Marland Kitchen OVER THE COUNTER MEDICATION  Cold and flu med     . OSELTAMIVIR PHOSPHATE 75 MG PO CAPS Oral Take 1 capsule (75 mg total) by mouth every 12 (twelve) hours. 10 capsule 0    BP 109/74  Pulse 69  Temp(Src) 98.3 F (36.8 C) (Oral)  Resp 20  SpO2 98%  Physical Exam  HENT:  Right Ear: External ear and ear canal normal.  Left Ear: Tympanic membrane, external ear and ear canal normal.  Nose: Mucosal edema and rhinorrhea present.  Mouth/Throat: Oropharynx is clear and moist and mucous membranes are normal. No posterior oropharyngeal edema or posterior oropharyngeal erythema.  Cardiovascular: Normal rate and regular rhythm.   Pulmonary/Chest: Effort normal and breath sounds normal.       Coughing is not productive  Lymphadenopathy:       Head (right side): No submandibular adenopathy present.       Head (left side): No submandibular adenopathy present.    She has no cervical adenopathy.  Skin: No rash noted.    ED Course  Procedures (including critical care time)  Labs Reviewed - No data to display No results found.   1. Influenza       MDM          Cathlyn Parsons, NP 09/20/11 1138

## 2011-09-25 NOTE — ED Provider Notes (Signed)
Medical screening examination/treatment/procedure(s) were performed by non-physician practitioner and as supervising physician I was immediately available for consultation/collaboration.  Luiz Blare MD   Luiz Blare, MD 09/25/11 1726

## 2015-12-12 DIAGNOSIS — N941 Unspecified dyspareunia: Secondary | ICD-10-CM | POA: Insufficient documentation

## 2015-12-12 DIAGNOSIS — Z6841 Body Mass Index (BMI) 40.0 and over, adult: Secondary | ICD-10-CM | POA: Insufficient documentation

## 2015-12-12 DIAGNOSIS — Z2821 Immunization not carried out because of patient refusal: Secondary | ICD-10-CM | POA: Insufficient documentation

## 2015-12-12 DIAGNOSIS — L709 Acne, unspecified: Secondary | ICD-10-CM | POA: Insufficient documentation

## 2016-01-28 ENCOUNTER — Emergency Department (HOSPITAL_BASED_OUTPATIENT_CLINIC_OR_DEPARTMENT_OTHER)
Admission: EM | Admit: 2016-01-28 | Discharge: 2016-01-28 | Disposition: A | Discharge status: Home / Self Care | Payer: 59 | Attending: Emergency Medicine | Admitting: Emergency Medicine

## 2016-01-28 ENCOUNTER — Encounter (HOSPITAL_BASED_OUTPATIENT_CLINIC_OR_DEPARTMENT_OTHER): Payer: Self-pay | Admitting: *Deleted

## 2016-01-28 DIAGNOSIS — Y9389 Activity, other specified: Secondary | ICD-10-CM | POA: Diagnosis not present

## 2016-01-28 DIAGNOSIS — Y998 Other external cause status: Secondary | ICD-10-CM | POA: Diagnosis not present

## 2016-01-28 DIAGNOSIS — S51832A Puncture wound without foreign body of left forearm, initial encounter: Secondary | ICD-10-CM | POA: Insufficient documentation

## 2016-01-28 DIAGNOSIS — Y9289 Other specified places as the place of occurrence of the external cause: Secondary | ICD-10-CM | POA: Diagnosis not present

## 2016-01-28 DIAGNOSIS — Z880 Allergy status to penicillin: Secondary | ICD-10-CM | POA: Diagnosis not present

## 2016-01-28 DIAGNOSIS — S5012XA Contusion of left forearm, initial encounter: Secondary | ICD-10-CM | POA: Insufficient documentation

## 2016-01-28 DIAGNOSIS — W5501XA Bitten by cat, initial encounter: Secondary | ICD-10-CM | POA: Diagnosis not present

## 2016-01-28 DIAGNOSIS — S61431A Puncture wound without foreign body of right hand, initial encounter: Secondary | ICD-10-CM | POA: Diagnosis not present

## 2016-01-28 DIAGNOSIS — Z23 Encounter for immunization: Secondary | ICD-10-CM | POA: Insufficient documentation

## 2016-01-28 DIAGNOSIS — S59912A Unspecified injury of left forearm, initial encounter: Secondary | ICD-10-CM | POA: Diagnosis present

## 2016-01-28 MED ORDER — TETANUS-DIPHTH-ACELL PERTUSSIS 5-2.5-18.5 LF-MCG/0.5 IM SUSP
0.5000 mL | Freq: Once | INTRAMUSCULAR | Status: AC
  Administered 2016-01-28: 0.5 mL via INTRAMUSCULAR
  Filled 2016-01-28: qty 0.5

## 2016-01-28 MED ORDER — DOXYCYCLINE HYCLATE 100 MG PO CAPS: 100.0000 mg | ORAL_CAPSULE | Freq: Two times a day (BID) | ORAL | Status: DC

## 2016-01-28 MED ORDER — CLINDAMYCIN HCL 150 MG PO CAPS: 300.0000 mg | ORAL_CAPSULE | Freq: Three times a day (TID) | ORAL | Status: DC

## 2016-01-28 NOTE — ED Notes (Signed)
Wounds cleaned with wound cleaner and bacitracin applied telfa dressing and applied and secured with kerlex. Tolerated well.

## 2016-01-28 NOTE — ED Provider Notes (Signed)
CSN: [id number]     Arrival date & time 01/28/16  1014 History   First MD Initiated Contact with Patient 01/28/16 1019     Chief Complaint  Patient presents with  . Animal Bite     (Consider location/radiation/quality/duration/timing/severity/associated sxs/prior Treatment) The history is provided by the patient and medical records.     30 y.o. F with no significant PMH presenting to the ED following A Bite that occurred last night. Patient states she was also up with her cat last night who got spooked by something in the dark and last on to her, biting her left forearm and right hand. She states her significant other who is an EMT cleansed wound, applied antibiotic ointment, and applied bandages. She states her cat is up-to-date on rabies vaccinations. She is unsure of the date of her last tetanus. Denies any fever or chills.Patient does have allergy to penicillin.  No past medical history on file. Past Surgical History  Procedure Laterality Date  . Cesarean section     Family History  Problem Relation Age of Onset  . Diabetes Father   . Heart failure Father    Social History  Substance Use Topics  . Smoking status: Never Smoker   . Smokeless tobacco: Not on file  . Alcohol Use: Yes   OB History    No data available     Review of Systems  Skin: Positive for wound.  All other systems reviewed and are negative.     Allergies  Amoxicillin  Home Medications   Prior to Admission medications   Medication Sig Start Date End Date Taking? Authorizing Provider  ibuprofen (ADVIL,MOTRIN) 400 MG tablet Take 800 mg by mouth every 6 (six) hours as needed.      Historical Provider, MD  OVER THE COUNTER MEDICATION Cold and flu med     Historical Provider, MD   BP 121/89 mmHg  Pulse 80  Temp(Src) 98 F (36.7 C) (Oral)  Resp 16  Ht 5\' 6"  (1.676 m)  Wt 133.358 kg  BMI 47.48 kg/m2  SpO2 100%   Physical Exam  Constitutional: She is oriented to person, place, and time. She  appears well-developed and well-nourished. No distress.  HENT:  Head: Normocephalic and atraumatic.  Mouth/Throat: Oropharynx is clear and moist.  Eyes: Conjunctivae and EOM are normal. Pupils are equal, round, and reactive to light.  Neck: Normal range of motion. Neck supple.  Cardiovascular: Normal rate, regular rhythm and normal heart sounds.   Pulmonary/Chest: Effort normal and breath sounds normal. No respiratory distress. She has no wheezes.  Abdominal: Soft. Bowel sounds are normal. There is no tenderness. There is no guarding.  Musculoskeletal: Normal range of motion. She exhibits no edema.  Puncture wounds noted to left dorsal forearm and ulnar aspect of right hand; wounds are clean without active bleeding or drainage; some bruising noted surrounding wound of left forearm; no bony deformities noted; radial pulses intact bilaterally; moving both arms, wrists, and all fingers normally; normal sensation throughout BUE  Neurological: She is alert and oriented to person, place, and time.  Skin: Skin is warm and dry. She is not diaphoretic.  Psychiatric: She has a normal mood and affect.  Nursing note and vitals reviewed.   ED Course  Procedures (including critical care time) Labs Review Labs Reviewed - No data to display  Imaging Review No results found. I have personally reviewed and evaluated these images and lab results as part of my medical decision-making.   EKG Interpretation  None      MDM   Final diagnoses:  Cat bite, initial encounter   30 year old female here with cat bite that occurred last night. Cat is UTD on vaccinations.  Unsure date of last tetanus so updated here today.  Wounds are small puncture wounds only.  Overall clean in appearance without superimposed infection, cellulitis, drainage, or bleeding currently.  Patient afebrile, non-toxic.  Given her penicillin allergy, will start on doxycycline and clindamycin per UptoDate recommendations.  Wounds cleansed  and dressed here.  Encouraged to keep wounds cleansed at home, monitor for any signs of developing infection.  FU with PCP.  Discussed plan with patient, he/she acknowledged understanding and agreed with plan of care.  Return precautions given for new or worsening symptoms.  Garlon Hatchet, PA-C 01/28/16 1115  Leta Baptist, MD 01/29/16 2009

## 2016-01-28 NOTE — ED Notes (Signed)
States she was taking her cat outside last pm and something spooked her and she was bitten by her cat on left forearm and to right hand. Pt states her cat has had its rabies shot.

## 2016-01-28 NOTE — Discharge Instructions (Signed)
Take the prescribed medication as directed.  Keep wounds clean at home, monitor for redness, swelling, drainage, high fever, etc. Follow-up with your primary care physician for re-check in the next few days.   Return to the ED for new or worsening symptoms.

## 2016-02-05 DIAGNOSIS — R51 Headache: Secondary | ICD-10-CM | POA: Insufficient documentation

## 2016-02-05 DIAGNOSIS — R519 Headache, unspecified: Secondary | ICD-10-CM | POA: Insufficient documentation

## 2017-01-31 ENCOUNTER — Emergency Department (HOSPITAL_BASED_OUTPATIENT_CLINIC_OR_DEPARTMENT_OTHER)
Admission: EM | Admit: 2017-01-31 | Discharge: 2017-02-01 | Disposition: A | Discharge status: Home / Self Care | Payer: 59 | Attending: Emergency Medicine | Admitting: Emergency Medicine

## 2017-01-31 ENCOUNTER — Encounter (HOSPITAL_BASED_OUTPATIENT_CLINIC_OR_DEPARTMENT_OTHER): Payer: Self-pay | Admitting: Emergency Medicine

## 2017-01-31 DIAGNOSIS — R002 Palpitations: Secondary | ICD-10-CM | POA: Diagnosis present

## 2017-01-31 DIAGNOSIS — I491 Atrial premature depolarization: Secondary | ICD-10-CM

## 2017-01-31 NOTE — ED Provider Notes (Signed)
[facility] Provider Note: Theresa Dell, MD, FACEP  CSN: [id number] MRN: [id number] ARRIVAL: 01/31/17 at 2352 ROOM: MH09/MH09  By signing my name below, I, Theresa Anderson, attest that this documentation has been prepared under the direction and in the presence of Theresa Libra, MD . Electronically Signed: Teofilo Anderson, ED Scribe. 02/01/2017. 1:09 AM.   CHIEF COMPLAINT  Palpitations   HISTORY OF PRESENT ILLNESS  Theresa Anderson is a 31 y.o. female with complaint of recurrent palpitations x 1-2 weeks. She states that it typically occurs at night when lying down, but she recently started noticing the symptoms during the day. She notes that the palpitations worsened significantly when she was playing pool yesterday. Pt complains of associated occasional transient SOB. Pt reports family hx of cardiac problems on her father's side. Pt denies any new medications, illicit drug use, or increased caffeine intake. No alleviating factors noted. Denies chest pain, nausea, vomiting.    History reviewed. No pertinent past medical history.  Past Surgical History:  Procedure Laterality Date  . CESAREAN SECTION    . CESAREAN SECTION      Family History  Problem Relation Age of Onset  . Diabetes Father   . Heart failure Father     Social History  Substance Use Topics  . Smoking status: Never Smoker  . Smokeless tobacco: Never Used  . Alcohol use No    Prior to Admission medications   Not on File    Allergies Amoxicillin   REVIEW OF SYSTEMS  Negative except as noted here or in the History of Present Illness.   PHYSICAL EXAMINATION  Initial Vital Signs There were no vitals taken for this visit.  Examination General: Well-developed, well-nourished female in no acute distress; appearance consistent with age of record HENT: normocephalic; atraumatic; no thyromegaly.  Eyes: pupils equal, round and reactive to light; extraocular muscles intact Neck:  supple Heart: regular rate and rhythm with frequent PACs; no murmur Lungs: clear to auscultation bilaterally Abdomen: soft; nondistended; nontender; no masses or hepatosplenomegaly; bowel sounds present Extremities: Trace edema of lower legs; No deformity; full range of motion; pulses normal Neurologic: Awake, alert and oriented; motor function intact in all extremities and symmetric; no facial droop Skin: Warm and dry Psychiatric: Normal mood and affect   RESULTS  Summary of this visit's results, reviewed by myself:   EKG Interpretation  Date/Time:  Monday February 01 2017 00:04:58 EDT Ventricular Rate:  89 PR Interval:    QRS Duration: 93 QT Interval:  383 QTC Calculation: 466 R Axis:   32 Text Interpretation:  Sinus rhythm Low voltage, precordial leads Borderline T wave abnormalities No previous ECGs available Confirmed by Khloee Garza  MD, Jonny Ruiz ([id number]) on 02/01/2017 12:07:55 AM      Laboratory Studies: Results for orders placed or performed during the hospital encounter of 01/31/17 (from the past 24 hour(s))  Basic metabolic panel     Status: None   Collection Time: 02/01/17 12:24 AM  Result Value Ref Range   Sodium 138 135 - 145 mmol/L   Potassium 3.7 3.5 - 5.1 mmol/L   Chloride 105 101 - 111 mmol/L   CO2 24 22 - 32 mmol/L   Glucose, Bld 96 65 - 99 mg/dL   BUN 12 6 - 20 mg/dL   Creatinine, Ser 8.46 0.44 - 1.00 mg/dL   Calcium 8.9 8.9 - 96.2 mg/dL   GFR calc non Af Amer >60 >60 mL/min   GFR calc Af Amer >60 >60  mL/min   Anion gap 9 5 - 15  CBC with Differential/Platelet     Status: Abnormal   Collection Time: 02/01/17 12:24 AM  Result Value Ref Range   WBC 10.5 4.0 - 10.5 K/uL   RBC 4.48 3.87 - 5.11 MIL/uL   Hemoglobin 12.4 12.0 - 15.0 g/dL   HCT 16.1 09.6 - 04.5 %   MCV 82.8 78.0 - 100.0 fL   MCH 27.7 26.0 - 34.0 pg   MCHC 33.4 30.0 - 36.0 g/dL   RDW 40.9 81.1 - 91.4 %   Platelets 348 150 - 400 K/uL   Neutrophils Relative % 59 %   Neutro Abs 6.1 1.7 - 7.7 K/uL    Lymphocytes Relative 27 %   Lymphs Abs 2.8 0.7 - 4.0 K/uL   Monocytes Relative 11 %   Monocytes Absolute 1.2 (H) 0.1 - 1.0 K/uL   Eosinophils Relative 3 %   Eosinophils Absolute 0.3 0.0 - 0.7 K/uL   Basophils Relative 0 %   Basophils Absolute 0.0 0.0 - 0.1 K/uL  Troponin I     Status: None   Collection Time: 02/01/17 12:28 AM  Result Value Ref Range   Troponin I <0.03 <0.03 ng/mL   Imaging Studies: Dg Chest 2 View  Result Date: 02/01/2017 CLINICAL DATA:  Heart palpitations for 10 days.  Nonsmoker. EXAM: CHEST  2 VIEW COMPARISON:  None. FINDINGS: Shallow inspiration. Normal heart size and pulmonary vascularity. No focal airspace disease or consolidation in the lungs. No blunting of costophrenic angles. No pneumothorax. Mediastinal contours appear intact. IMPRESSION: No active cardiopulmonary disease. Electronically Signed   By: Burman Nieves M.D.   On: 02/01/2017 00:57    ED COURSE  Nursing notes and initial vitals signs, including pulse oximetry, reviewed.  Vitals:   02/01/17 0001 02/01/17 0003  BP:  112/74  Pulse:  90  Resp:  18  Temp:  98.3 F (36.8 C)  TempSrc:  Oral  SpO2:  100%  Weight: 300 lb (136.1 kg)   Height:  (1.676 m)    1:08 AM Frequent PACs seen on monitor. No sustained arrhythmias were seen. Will refer to cardiology for further evaluation and treatment. A TSH was sent but she was advised that that will need to be addressed at follow-up.  PROCEDURES    ED DIAGNOSES     ICD-9-CM ICD-10-CM   1. Premature atrial contractions 427.61 I49.1     I personally performed the services described in this documentation, which was scribed in my presence. The recorded information has been reviewed and is accurate.     Theresa Libra, MD 02/01/17 [phone]

## 2017-01-31 NOTE — ED Triage Notes (Signed)
Pt reports palpitations x 1-2 weeks intermittently.  Reports that it generally occurs at night when she was laying in bed.  Denies SOB, N/V. nontender on palpation.  Denies anything making sensation better or worse.  Pt ambulatory, denies wheelchair.

## 2017-02-01 ENCOUNTER — Encounter (HOSPITAL_BASED_OUTPATIENT_CLINIC_OR_DEPARTMENT_OTHER): Payer: Self-pay | Admitting: *Deleted

## 2017-02-01 ENCOUNTER — Emergency Department (HOSPITAL_BASED_OUTPATIENT_CLINIC_OR_DEPARTMENT_OTHER): Payer: 59

## 2017-02-01 LAB — BASIC METABOLIC PANEL
ANION GAP: 9 (ref 5–15)
BUN: 12 mg/dL (ref 6–20)
CHLORIDE: 105 mmol/L (ref 101–111)
CO2: 24 mmol/L (ref 22–32)
Calcium: 8.9 mg/dL (ref 8.9–10.3)
Creatinine, Ser: 0.79 mg/dL (ref 0.44–1.00)
GFR calc non Af Amer: >60 mL/min (ref >60)
Glucose, Bld: 96 mg/dL (ref 65–99)
Potassium: 3.7 mmol/L (ref 3.5–5.1)
Sodium: 138 mmol/L (ref 135–145)

## 2017-02-01 LAB — CBC WITH DIFFERENTIAL/PLATELET
Basophils Absolute: 0 10*3/uL (ref 0.0–0.1)
Basophils Relative: 0 %
EOS PCT: 3 %
Eosinophils Absolute: 0.3 10*3/uL (ref 0.0–0.7)
HEMATOCRIT: 37.1 % (ref 36.0–46.0)
HEMOGLOBIN: 12.4 g/dL (ref 12.0–15.0)
LYMPHS ABS: 2.8 10*3/uL (ref 0.7–4.0)
LYMPHS PCT: 27 %
MCH: 27.7 pg (ref 26.0–34.0)
MCHC: 33.4 g/dL (ref 30.0–36.0)
MCV: 82.8 fL (ref 78.0–100.0)
Monocytes Absolute: 1.2 10*3/uL — ABNORMAL HIGH (ref 0.1–1.0)
Monocytes Relative: 11 %
NEUTROS ABS: 6.1 10*3/uL (ref 1.7–7.7)
NEUTROS PCT: 59 %
Platelets: 348 10*3/uL (ref 150–400)
RBC: 4.48 MIL/uL (ref 3.87–5.11)
RDW: 13.9 % (ref 11.5–15.5)
WBC: 10.5 10*3/uL (ref 4.0–10.5)

## 2017-02-01 LAB — TSH: TSH: 8.496 u[IU]/mL — ABNORMAL HIGH (ref 0.350–4.500)

## 2017-02-01 LAB — TROPONIN I: Troponin I: <0.03 ng/mL (ref <0.03)

## 2017-02-10 ENCOUNTER — Encounter: Payer: Self-pay | Admitting: Physician Assistant

## 2017-02-10 ENCOUNTER — Ambulatory Visit (INDEPENDENT_AMBULATORY_CARE_PROVIDER_SITE_OTHER): Payer: 59 | Admitting: Physician Assistant

## 2017-02-10 VITALS — BP 118/80 | HR 73 | Temp 98.4°F | Resp 18 | Wt 324.8 lb

## 2017-02-10 DIAGNOSIS — I491 Atrial premature depolarization: Secondary | ICD-10-CM | POA: Diagnosis not present

## 2017-02-10 DIAGNOSIS — E039 Hypothyroidism, unspecified: Secondary | ICD-10-CM | POA: Diagnosis not present

## 2017-02-10 DIAGNOSIS — R002 Palpitations: Secondary | ICD-10-CM | POA: Diagnosis not present

## 2017-02-10 HISTORY — DX: Palpitations: R00.2

## 2017-02-10 HISTORY — DX: Atrial premature depolarization: I49.1

## 2017-02-10 HISTORY — DX: Hypothyroidism, unspecified: E03.9

## 2017-02-10 LAB — TSH: TSH: 2.87 m[IU]/L

## 2017-02-10 LAB — T4, FREE: Free T4: 0.9 ng/dL (ref 0.8–1.8)

## 2017-02-10 MED ORDER — LEVOTHYROXINE SODIUM 50 MCG PO TABS: 50.0000 ug | ORAL_TABLET | Freq: Every day | ORAL | 1 refills | Status: DC

## 2017-02-10 NOTE — Progress Notes (Signed)
Patient ID: Theresa Anderson MRN: [id number], DOB: [birth year], 31 y.o. Date of Encounter: @  Chief Complaint:  Chief Complaint  Patient presents with  . New Patient (Initial Visit)    HPI: 31 y.o. year old female  presents as a New Patient to Establish Care.   She states that she has been going to her GYN but had been needing to establish a PCP. She has gone to her GYN within the past year.  She states that she went to the [facility] 1-1/2 weeks ago because of palpitations.  Was told that she had PACs.  Also was told that the lab showed that her thyroid number was up. Was to follow-up regarding the thyroid with PCP. Regarding the PACs--Has appointment to follow-up with Cardiology April 27.  Says that she is wondering if the thyroid lab could explain the PACs and also could explain her weight. Says that she recently had worked out with a Systems analyst for one month and had no weight loss.  Has no other concerns to address today.  States that she has no other significant past medical history.   No past medical history on file.   Home Meds: No outpatient prescriptions prior to visit.   No facility-administered medications prior to visit.     Allergies:  Allergies  Allergen Reactions  . Amoxicillin     Social History   Social History  . Marital status: Single    Spouse name: N/A  . Number of children: N/A  . Years of education: N/A   Occupational History  . Not on file.   Social History Main Topics  . Smoking status: Never Smoker  . Smokeless tobacco: Never Used  . Alcohol use No  . Drug use: No  . Sexual activity: Not on file   Other Topics Concern  . Not on file   Social History Narrative  . No narrative on file    Family History  Problem Relation Age of Onset  . Diabetes Father   . Heart failure Father      Review of Systems:  See HPI for pertinent ROS. All other ROS negative.    Physical Exam: Blood pressure 118/80,  pulse 73, temperature 98.4 F (36.9 C), temperature source Oral, resp. rate 18, weight (!) 324 lb 12.8 oz (147.3 kg), last menstrual period 01/09/2017, SpO2 98 %., Body mass index is 52.42 kg/m. General: Obese WF. Appears in no acute distress. Neck: Supple. No thyromegaly. No lymphadenopathy. Lungs: Clear bilaterally to auscultation without wheezes, rales, or rhonchi. Breathing is unlabored. Heart: RRR with S1 S2. No murmurs, rubs, or gallops. Musculoskeletal:  Strength and tone normal for age. Extremities/Skin: Warm and dry.  Neuro: Alert and oriented X 3. Moves all extremities spontaneously. Gait is normal. CNII-XII grossly in tact. Psych:  Responds to questions appropriately with a normal affect.     ASSESSMENT AND PLAN:  31 y.o. year old female with  1. Hypothyroidism, unspecified type Initially, I was going to go ahead and start Synthroid 50 g daily and have her return for TSH in 6 weeks. However, prior to doing so, will recheck TSH as well as a free T4 today. - levothyroxine (SYNTHROID, LEVOTHROID) 50 MCG tablet; Take 1 tablet (50 mcg total) by mouth daily.  Dispense: 30 tablet; Refill: 1 - TSH; Future - TSH - T4, free  2. Palpitations She has f/u appt at Cardiology on 02/12/2017 3. PAC (premature atrial contraction) She has f/u appt at Cardiology  on 02/12/2017  Will f/u lab results.  Can wait to have f/u OV 6 months--sooner if needed.   Signed, [address] [place], [place], [facility] 02/10/2017 10:04 AM

## 2017-02-12 ENCOUNTER — Ambulatory Visit (INDEPENDENT_AMBULATORY_CARE_PROVIDER_SITE_OTHER): Payer: 59 | Admitting: Cardiology

## 2017-02-12 ENCOUNTER — Encounter: Payer: Self-pay | Admitting: Cardiology

## 2017-02-12 VITALS — BP 114/72 | HR 78 | Ht 66.0 in | Wt 324.2 lb

## 2017-02-12 DIAGNOSIS — I491 Atrial premature depolarization: Secondary | ICD-10-CM | POA: Diagnosis not present

## 2017-02-12 NOTE — Patient Instructions (Addendum)
Robbie Lis, PA recommends that you schedule a follow-up appointment as needed.

## 2017-02-12 NOTE — Progress Notes (Signed)
02/12/2017 Art Buff   01-03-1986  [id number]  Primary Physician No PCP Per Patient Primary Cardiologist: New (Dr. Anne Fu, DOD)  Reason for Visit/CC: New Patient Evaluation for Palpitations  Referring MD: Dr. Paula Libra, ED Physician   HPI:  Theresa Anderson is a 31 y.o. female who is being seen today, as a new patient, for the evaluation of palpitations at the request of Dr. Paula Libra, ED Physician.   No significant PMH. Family h/o of cardiac disease is +. Both of her parents have been followed by Dr. Eden Emms. Her father had CHF but is deceased. Her mother has valvular disease, that is being monitored.   She was seen in the ED on 01/31/17 and complained of palpitations. Symptoms have been present, intermittently, for the last month. Feels like "flutters" in chest. No sustained palpitations. She occasionally gets brief dyspnea when this happens but it goes away pretty quickly. No CP. No exertional symptoms. No dizziness, syncope/ near syncope.   In ED, EKG/telemetry showed frequent PACs, but no sustained arrhthymias per report. Patient was symptomatic with palpitations when PACs were present. TSH was high at 8.4. However, free T4  was normal. CBC and BMP both unremarkable. No anemia. K was 3.7. Troponin also negative. She was given reassurance and instructed to f/u in our office.   I have reviewed ED EKG. This shows Sinus rhythm Low voltage, precordial leads (may be due to obesity/body habitus). Borderline T wave abnormalities, however no previous ECGs available.   She is currently asymptomatic in clinic today. She continues to have occasional flutters, but no syncope/ near syncope. She drinks little caffeine. Only drinks ETOH occasionally. No fever, chills, n/v/d. Her significant other notes that she snores but no pauses during sleep.    No outpatient prescriptions have been marked as taking for the 02/12/17 encounter (Office Visit) with Allayne Butcher, PA-C.   Allergies    Allergen Reactions  . Amoxicillin     Hives   Past Medical History:  Diagnosis Date  . Hypothyroidism 02/10/2017  . PAC (premature atrial contraction) 02/10/2017  . Palpitations 02/10/2017   Family History  Problem Relation Age of Onset  . Diabetes Father   . Heart failure Father    Past Surgical History:  Procedure Laterality Date  . CESAREAN SECTION    . CESAREAN SECTION     Social History   Social History  . Marital status: Single    Spouse name: N/A  . Number of children: N/A  . Years of education: N/A   Occupational History  . Not on file.   Social History Main Topics  . Smoking status: Never Smoker  . Smokeless tobacco: Never Used  . Alcohol use No  . Drug use: No  . Sexual activity: Not on file   Other Topics Concern  . Not on file   Social History Narrative  . No narrative on file     Review of Systems: General: negative for chills, fever, night sweats or weight changes.  Cardiovascular: negative for chest pain, dyspnea on exertion, edema, orthopnea, palpitations, paroxysmal nocturnal dyspnea or shortness of breath Dermatological: negative for rash Respiratory: negative for cough or wheezing Urologic: negative for hematuria Abdominal: negative for nausea, vomiting, diarrhea, bright red blood per rectum, melena, or hematemesis Neurologic: negative for visual changes, syncope, or dizziness All other systems reviewed and are otherwise negative except as noted above.   Physical Exam:  Blood pressure 114/72, pulse 78, height  (1.676 m), weight Marland Kitchen)  324 lb 3.2 oz (147.1 kg), SpO2 98 %.  General appearance: alert, cooperative, no distress and morbidly obese Neck: no carotid bruit and no JVD Lungs: clear to auscultation bilaterally Heart: regular rate and rhythm, S1, S2 normal, no murmur, click, rub or gallop Extremities: extremities normal, atraumatic, no cyanosis or edema Pulses: 2+ and symmetric Skin: Skin color, texture, turgor normal. No rashes  or lesions Neurologic: Grossly normal  EKG not performed, but ED EKG personally reviewed  Sinus rhythm Low voltage, precordial leads Borderline T wave abnormalities No previous ECGs available  ASSESSMENT AND PLAN:   1. PACs: frequent PACs were noted in ED, which correlated with the patient's symptoms. She was symptomatic with palpitations when PACs were captured. CBC, BMP, troponin level and thyroid function studies ok. Other than morbid obesity, her physical exam is otherwise benign. I've discussed patient plan with Dr. Anne Fu, DOD. We will not treat her PACs as this is a benign condition. Conservative management only. Avoid caffeine and ETOH, which may exacerbate arrhthymias. Increase physical activity for weight loss and get plenty of sleep nightly. We can consider treatment later if condition worsens. Pt was given reassurance and advised to f/u with PCP to consider possible sleep study to r/o OSA given her obesity and palpitations. She can f/u with [place] PRN.   Armaan Pond Delmer Islam, MHS [facility] 02/12/2017 8:48 AM

## 2017-03-25 ENCOUNTER — Ambulatory Visit: Payer: 59 | Admitting: Family Medicine

## 2017-04-20 ENCOUNTER — Ambulatory Visit (INDEPENDENT_AMBULATORY_CARE_PROVIDER_SITE_OTHER): Payer: 59 | Admitting: Family Medicine

## 2017-04-20 ENCOUNTER — Encounter: Payer: Self-pay | Admitting: Family Medicine

## 2017-04-20 VITALS — BP 132/88 | HR 86 | Temp 98.4°F | Resp 18 | Ht 66.0 in | Wt 326.0 lb

## 2017-04-20 DIAGNOSIS — M7989 Other specified soft tissue disorders: Secondary | ICD-10-CM | POA: Diagnosis not present

## 2017-04-20 NOTE — Progress Notes (Signed)
   Subjective:    Patient ID: Theresa Anderson, female    DOB: 09/13/1986, 31 y.o.   MRN: [id number]  HPI  Patient presents with swelling in her feet. She is also gained approximately 20 pounds over the last few months according to her report. She has +1 pitting edema in both legs distal to the knee. She has several varicose veins. She reports aching pain in her legs. In the morning symptoms are mild. By the evening after being on her feet all day symptoms are worse. She denies any chest pain. She denies any shortness of breath. She denies any dyspnea on exertion. She denies any orthopnea or paroxysmal nocturnal dyspnea. She states that there is no chance she could be pregnant. She denies any change in her urine output. She denies any history of liver problems. There is no family history of heart failure Past Medical History:  Diagnosis Date  . Hypothyroidism 02/10/2017  . PAC (premature atrial contraction) 02/10/2017  . Palpitations 02/10/2017   Past Surgical History:  Procedure Laterality Date  . CESAREAN SECTION    . CESAREAN SECTION     No current outpatient prescriptions on file prior to visit.   No current facility-administered medications on file prior to visit.    Allergies  Allergen Reactions  . Amoxicillin     Hives   Social History   Social History  . Marital status: Single    Spouse name: N/A  . Number of children: N/A  . Years of education: N/A   Occupational History  . Not on file.   Social History Main Topics  . Smoking status: Never Smoker  . Smokeless tobacco: Never Used  . Alcohol use No  . Drug use: No  . Sexual activity: Not on file   Other Topics Concern  . Not on file   Social History Narrative  . No narrative on file     Review of Systems  All other systems reviewed and are negative.      Objective:   Physical Exam  Constitutional: She appears well-developed and well-nourished.  Neck: Neck supple. No JVD present. No thyromegaly present.    Cardiovascular: Normal rate, regular rhythm and normal heart sounds.   No murmur heard. Pulmonary/Chest: Effort normal and breath sounds normal. No respiratory distress. She has no wheezes. She has no rales.  Abdominal: Soft. Bowel sounds are normal.  Musculoskeletal: She exhibits edema.  Vitals reviewed.         Assessment & Plan:  Leg swelling - Plan: CBC with Differential/Platelet, COMPLETE METABOLIC PANEL WITH GFR, Brain natriuretic peptide, Urinalysis, Routine w reflex microscopic, TSH  I believe this is likely due to chronic venous insufficiency. Recommended wearing compression hose and taking horse chestnut seed extract.  He can also use Lasix on an as-needed basis to help the swelling. Given her weight gain, I will reassess her TSH. I will also check a urinalysis to rule out nephrotic syndrome. I'll check a CMP to rule out evidence of adrenal insufficiency. I will check her liver function test and also check a BNP although she has no symptoms of congestive heart failure. If labs are normal, we'll focus on symptom control with diuretics on an as-needed basis, compression hose, and HCSE

## 2017-04-21 LAB — CBC WITH DIFFERENTIAL/PLATELET
BASOS ABS: 0 {cells}/uL (ref 0–200)
Basophils Relative: 0 %
EOS ABS: 388 {cells}/uL (ref 15–500)
Eosinophils Relative: 4 %
HCT: 38.7 % (ref 35.0–45.0)
HEMOGLOBIN: 12.6 g/dL (ref 12.0–15.0)
Lymphocytes Relative: 22 %
Lymphs Abs: 2134 cells/uL (ref 850–3900)
MCH: 26.8 pg — AB (ref 27.0–33.0)
MCHC: 32.6 g/dL (ref 32.0–36.0)
MCV: 82.2 fL (ref 80.0–100.0)
MONOS PCT: 10 %
MPV: 9.4 fL (ref 7.5–12.5)
Monocytes Absolute: 970 cells/uL — ABNORMAL HIGH (ref 200–950)
NEUTROS ABS: 6208 {cells}/uL (ref 1500–7800)
Neutrophils Relative %: 64 %
Platelets: 344 10*3/uL (ref 140–400)
RBC: 4.71 MIL/uL (ref 3.80–5.10)
RDW: 14.4 % (ref 11.0–15.0)
WBC: 9.7 10*3/uL (ref 3.8–10.8)

## 2017-04-21 LAB — COMPLETE METABOLIC PANEL WITH GFR
ALT: 18 U/L (ref 6–29)
AST: 14 U/L (ref 10–30)
Albumin: 3.8 g/dL (ref 3.6–5.1)
Alkaline Phosphatase: 94 U/L (ref 33–115)
BUN: 11 mg/dL (ref 7–25)
CO2: 23 mmol/L (ref 20–31)
CREATININE: 0.72 mg/dL (ref 0.50–1.10)
Calcium: 9 mg/dL (ref 8.6–10.2)
Chloride: 104 mmol/L (ref 98–110)
GFR, Est African American: >89 mL/min (ref >=60)
GFR, Est Non African American: >89 mL/min (ref >=60)
GLUCOSE: 76 mg/dL (ref 70–99)
POTASSIUM: 4 mmol/L (ref 3.5–5.3)
SODIUM: 138 mmol/L (ref 135–146)
Total Bilirubin: 0.5 mg/dL (ref 0.2–1.2)
Total Protein: 7 g/dL (ref 6.1–8.1)

## 2017-04-21 LAB — URINALYSIS, ROUTINE W REFLEX MICROSCOPIC
BILIRUBIN URINE: NEGATIVE
GLUCOSE, UA: NEGATIVE
Hgb urine dipstick: NEGATIVE
Ketones, ur: NEGATIVE
Leukocytes, UA: NEGATIVE
Nitrite: NEGATIVE
PH: 5.5 (ref 5.0–8.0)
Protein, ur: NEGATIVE
SPECIFIC GRAVITY, URINE: 1.007 (ref 1.001–1.035)

## 2017-04-21 LAB — BRAIN NATRIURETIC PEPTIDE: Brain Natriuretic Peptide: 58.5 pg/mL (ref <100)

## 2017-04-21 LAB — TSH: TSH: 1.79 mIU/L

## 2017-04-22 ENCOUNTER — Encounter: Payer: Self-pay | Admitting: Family Medicine

## 2017-04-22 MED ORDER — FUROSEMIDE 20 MG PO TABS: 20.0000 mg | ORAL_TABLET | Freq: Every day | ORAL | 2 refills | Status: DC

## 2017-05-01 ENCOUNTER — Encounter (HOSPITAL_BASED_OUTPATIENT_CLINIC_OR_DEPARTMENT_OTHER): Payer: Self-pay | Admitting: *Deleted

## 2017-05-01 ENCOUNTER — Emergency Department (HOSPITAL_BASED_OUTPATIENT_CLINIC_OR_DEPARTMENT_OTHER)
Admission: EM | Admit: 2017-05-01 | Discharge: 2017-05-01 | Disposition: A | Discharge status: Home / Self Care | Payer: 59 | Attending: Emergency Medicine | Admitting: Emergency Medicine

## 2017-05-01 DIAGNOSIS — E039 Hypothyroidism, unspecified: Secondary | ICD-10-CM | POA: Diagnosis not present

## 2017-05-01 DIAGNOSIS — R52 Pain, unspecified: Secondary | ICD-10-CM | POA: Insufficient documentation

## 2017-05-01 DIAGNOSIS — W57XXXA Bitten or stung by nonvenomous insect and other nonvenomous arthropods, initial encounter: Secondary | ICD-10-CM | POA: Insufficient documentation

## 2017-05-01 MED ORDER — DOXYCYCLINE HYCLATE 100 MG PO CAPS: ORAL_CAPSULE | ORAL | 0 refills | Status: DC

## 2017-05-01 NOTE — ED Triage Notes (Signed)
Patient states she found a tick on her right ear three days ago.  States she was in the woods the day before.  States for the last 24 hours she has developed generalized body aches like the flu.

## 2017-05-01 NOTE — Discharge Instructions (Signed)
Fill the prescription for doxycycline you have been given today if you develop headaches, fevers, rash, or other new and concerning symptoms.

## 2017-05-02 NOTE — ED Provider Notes (Signed)
[facility] Provider Note   CSN: [id number] Arrival date & time: 05/01/17  1500     History   Chief Complaint Chief Complaint  Patient presents with  . Tick Removal    Tick bite    HPI Theresa Anderson is a 31 y.o. female.  Patient is a 31 year old female who presents with complaints of malaise, headache, and generally not feeling well. This is been ongoing for the past 2 days. 3 days ago, she found a tick located on the pinna of her year. She denies any rash, fevers.   The history is provided by the patient.    Past Medical History:  Diagnosis Date  . Hypothyroidism 02/10/2017  . PAC (premature atrial contraction) 02/10/2017  . Palpitations 02/10/2017    Patient Active Problem List   Diagnosis Date Noted  . Hypothyroidism 02/10/2017  . Palpitations 02/10/2017  . PAC (premature atrial contraction) 02/10/2017    Past Surgical History:  Procedure Laterality Date  . CESAREAN SECTION    . CESAREAN SECTION      OB History    No data available       Home Medications    Prior to Admission medications   Medication Sig Start Date End Date Taking? Authorizing Provider  furosemide (LASIX) 20 MG tablet Take 1 tablet (20 mg total) by mouth daily. 04/22/17  Yes Donita BrooksPickard, Warren T, MD  levothyroxine (SYNTHROID, LEVOTHROID) 100 MCG tablet Take 100 mcg by mouth daily before breakfast.   Yes [provider]  doxycycline (VIBRAMYCIN) 100 MG capsule One po bid x 14 days 05/01/17   Geoffery Lyonselo, Meir Elwood, MD    Family History Family History  Problem Relation Age of Onset  . Diabetes Father   . Heart failure Father     Social History Social History  Substance Use Topics  . Smoking status: Never Smoker  . Smokeless tobacco: Never Used  . Alcohol use No     Allergies   Amoxicillin   Review of Systems Review of Systems  All other systems reviewed and are negative.    Physical Exam Updated Vital Signs BP 139/87 (BP Location: Left Arm)   Pulse 80    Temp 98.6 F (37 C) (Oral)   Resp 16   LMP 04/26/2017 (Exact Date)   SpO2 99%   Physical Exam  Constitutional: She is oriented to person, place, and time. She appears well-developed and well-nourished. No distress.  HENT:  Head: Normocephalic and atraumatic.  Eyes: Pupils are equal, round, and reactive to light. EOM are normal.  Neck: Normal range of motion. Neck supple.  There is no nuchal rigidity.  Cardiovascular: Normal rate and regular rhythm.  Exam reveals no gallop and no friction rub.   No murmur heard. Pulmonary/Chest: Effort normal and breath sounds normal. No respiratory distress. She has no wheezes.  Abdominal: Soft. Bowel sounds are normal. She exhibits no distension. There is no tenderness.  Musculoskeletal: Normal range of motion.  Lymphadenopathy:    She has no cervical adenopathy.  Neurological: She is alert and oriented to person, place, and time.  Skin: Skin is warm and dry. No rash noted. She is not diaphoretic.  Nursing note and vitals reviewed.    ED Treatments / Results  Labs (all labs ordered are listed, but only abnormal results are displayed) Labs Reviewed - No data to display  EKG  EKG Interpretation None       Radiology No results found.  Procedures Procedures (including critical care time)  Medications  Ordered in ED Medications - No data to display   Initial Impression / Assessment and Plan / ED Course  I have reviewed the triage vital signs and the nursing notes.  Pertinent labs & imaging results that were available during my care of the patient were reviewed by me and considered in my medical decision making (see chart for details).  Patient with the above complaints starting 1 day after a tick bite. I doubt that these are related. In discussion with the patient who is concerned about Lyme's disease, I have agreed to prescribe doxycycline but have advised her to only start this if she is not improving in the next 2-3 days or if she  develops rash.  Final Clinical Impressions(s) / ED Diagnoses   Final diagnoses:  Tick bite, initial encounter    New Prescriptions Discharge Medication List as of 05/01/2017  5:45 PM    START taking these medications   Details  doxycycline (VIBRAMYCIN) 100 MG capsule One po bid x 14 days, Print         Geoffery Lyons, MD 05/02/17 2256

## 2017-06-09 ENCOUNTER — Encounter: Payer: Self-pay | Admitting: Family Medicine

## 2017-07-08 ENCOUNTER — Encounter: Payer: Self-pay | Admitting: Family Medicine

## 2017-10-28 ENCOUNTER — Other Ambulatory Visit: Payer: Self-pay | Admitting: Family Medicine

## 2018-06-03 IMAGING — CR DG CHEST 2V
2 series · 2 of 2 positions shown · non-contrast
Comparison: None.

CLINICAL DATA: Heart palpitations for 10 days.  Nonsmoker.

EXAM:
CHEST  2 VIEW

[w chest pa]
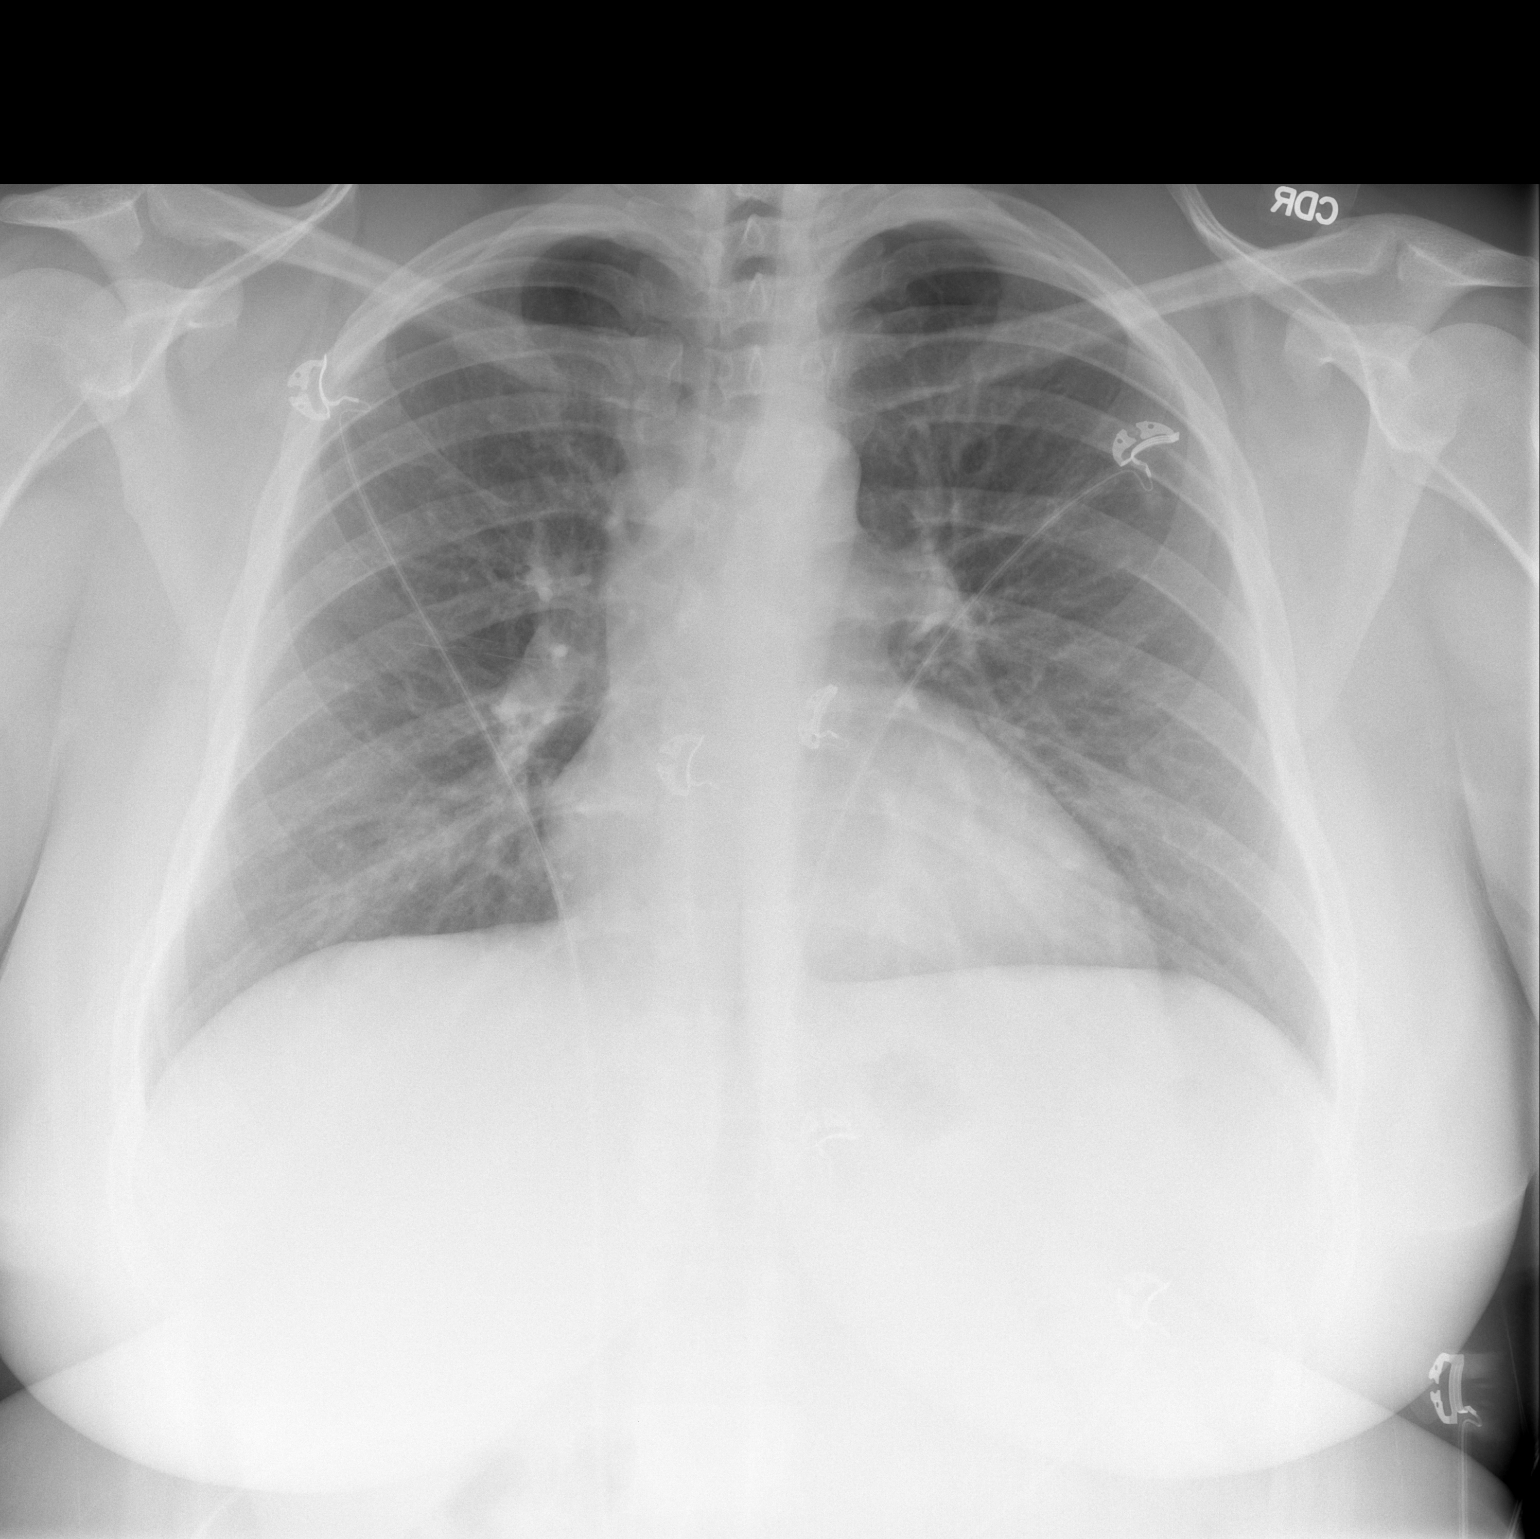

[w chest lat *]
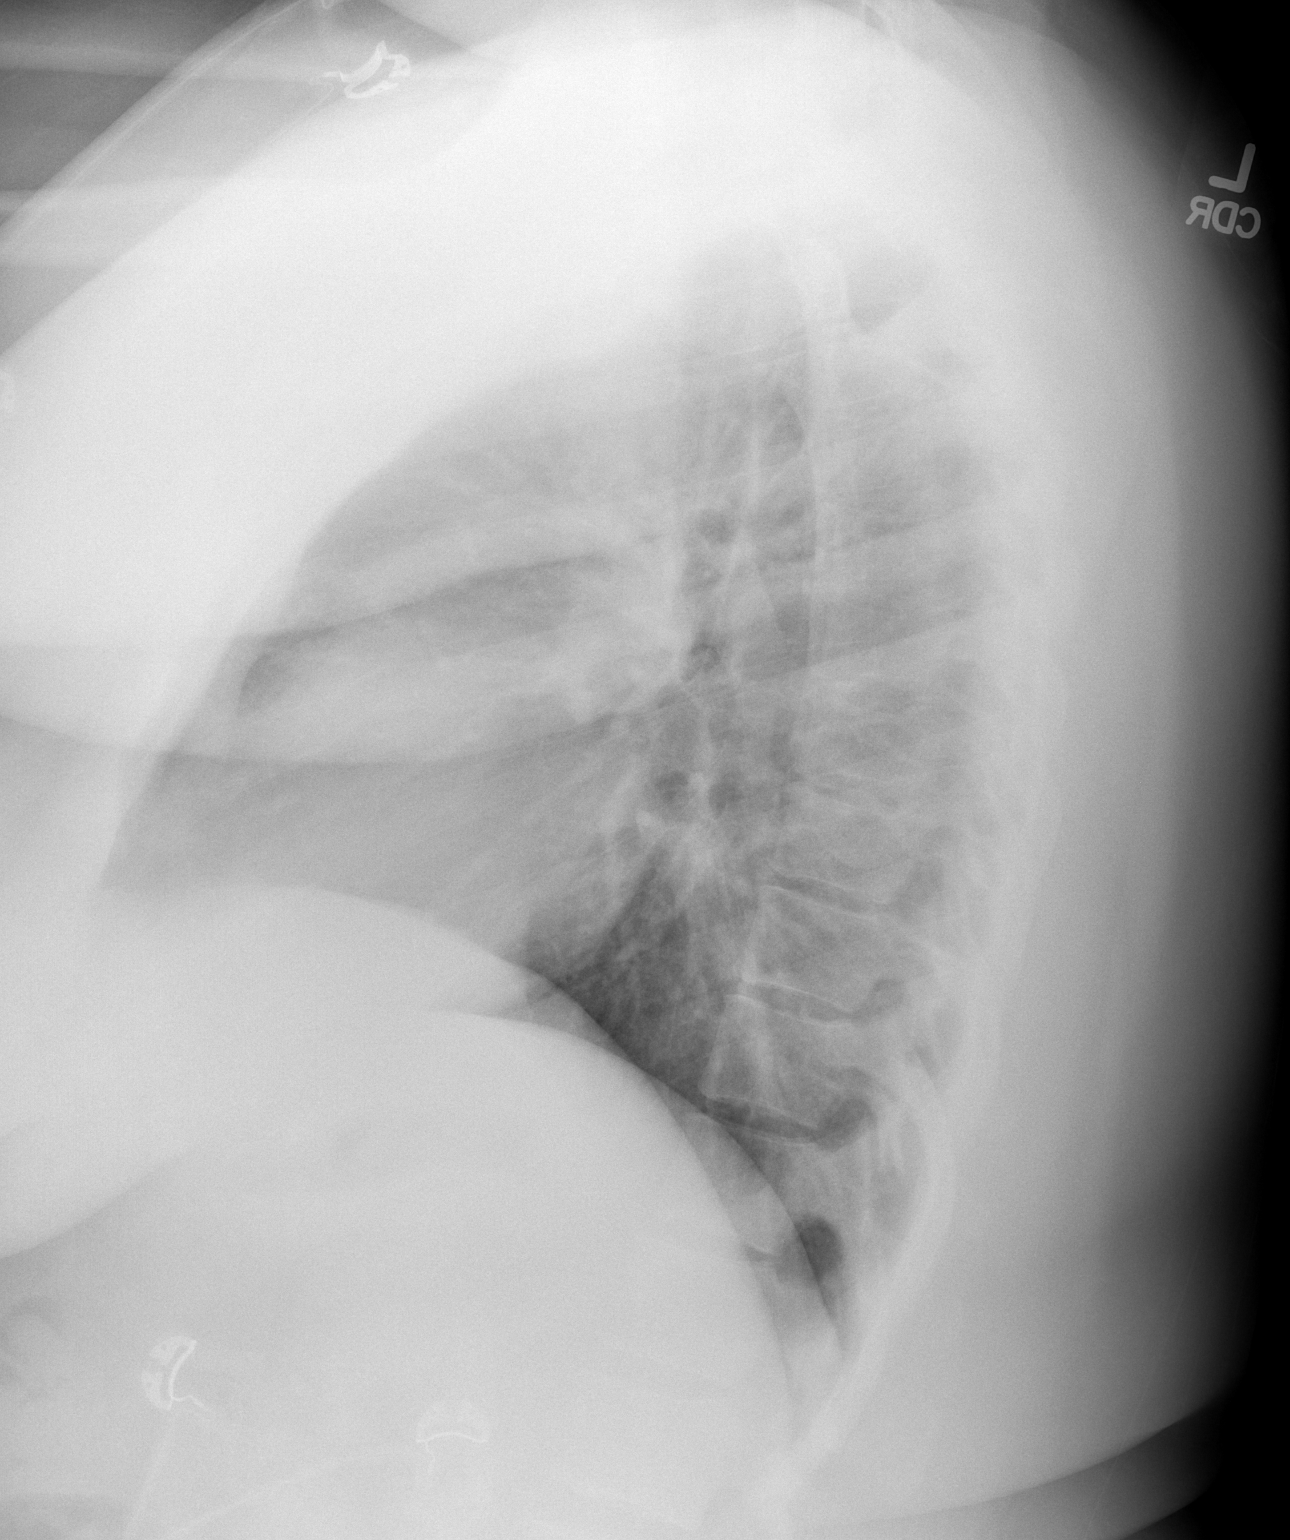

[2 of 2 positions shown; findings below may reference images not displayed]

FINDINGS: Shallow inspiration. Normal heart size and pulmonary vascularity. No
focal airspace disease or consolidation in the lungs. No blunting of
costophrenic angles. No pneumothorax. Mediastinal contours appear
intact.
IMPRESSION: No active cardiopulmonary disease.

## 2018-08-13 ENCOUNTER — Other Ambulatory Visit: Payer: Self-pay | Admitting: Podiatry

## 2018-08-13 ENCOUNTER — Ambulatory Visit (INDEPENDENT_AMBULATORY_CARE_PROVIDER_SITE_OTHER): Payer: 59

## 2018-08-13 ENCOUNTER — Ambulatory Visit: Payer: 59 | Admitting: Podiatry

## 2018-08-13 VITALS — BP 120/76 | HR 71

## 2018-08-13 DIAGNOSIS — M7750 Other enthesopathy of unspecified foot: Secondary | ICD-10-CM

## 2018-08-13 DIAGNOSIS — M19079 Primary osteoarthritis, unspecified ankle and foot: Secondary | ICD-10-CM

## 2018-08-13 DIAGNOSIS — M779 Enthesopathy, unspecified: Principal | ICD-10-CM

## 2018-08-13 DIAGNOSIS — M778 Other enthesopathies, not elsewhere classified: Secondary | ICD-10-CM

## 2018-08-13 DIAGNOSIS — M7752 Other enthesopathy of left foot: Secondary | ICD-10-CM

## 2018-08-13 DIAGNOSIS — M7751 Other enthesopathy of right foot: Secondary | ICD-10-CM | POA: Diagnosis not present

## 2018-08-13 NOTE — Progress Notes (Signed)
  Subjective:  Patient ID: Theresa Anderson, female    DOB: 13-Aug-1986,  MRN: [id number]  Chief Complaint  Patient presents with  . Flat Foot    bilateral foot pain, left worse than right    32 y.o. female presents with the above complaint. Pain in top of both feet, left worse than right. Pain present for the past few months getting worse. Denies other pedal issues.  Review of Systems: Negative except as noted in the HPI. Denies N/V/F/Ch.  No past medical history on file.  Current Outpatient Medications:  .  cyclobenzaprine (FLEXERIL) 5 MG tablet, , Disp: , Rfl: 0 .  levothyroxine (SYNTHROID, LEVOTHROID) 100 MCG tablet, , Disp: , Rfl:  .  methocarbamol (ROBAXIN) 500 MG tablet, , Disp: , Rfl:  .  nitrofurantoin, macrocrystal-monohydrate, (MACROBID) 100 MG capsule, TK ONE C PO  BID FOR 5 DAYS, Disp: , Rfl: 0 .  phenazopyridine (PYRIDIUM) 200 MG tablet, TK 1 T PO TID AFTER MEALS PRF 2 DAYS, Disp: , Rfl: 0  Social History   Tobacco Use  Smoking Status Not on file    Allergies  Allergen Reactions  . Amoxicillin    Objective:   Vitals:   08/13/18 1009  BP: 120/76  Pulse: 71   There is no height or weight on file to calculate BMI. Constitutional Well developed. Well nourished.  Vascular Dorsalis pedis pulses palpable bilaterally. Posterior tibial pulses palpable bilaterally. Capillary refill normal to all digits.  No cyanosis or clubbing noted. Pedal hair growth normal.  Neurologic Normal speech. Oriented to person, place, and time. Epicritic sensation to light touch grossly present bilaterally.  Dermatologic Nails well groomed and normal in appearance. No open wounds. No skin lesions.  Orthopedic: Normal joint ROM without pain or crepitus bilaterally. No visible deformities. POP dorsal midfoot bilat   Radiographs: Taken and reviewed. Midoot DJD. No acute fractures. Assessment:   1. Arthritis of midfoot   2. Tendinitis of ankle or foot    Plan:  Patient was  evaluated and treated and all questions answered.  Midfoot arthritis -XR reviewed as above.  -Injection delivered to both feet. -Educated on etiology -Discussed possible surgery in future should pain persist.  Procedure: Joint Injection Location: Bilateral dorsal TMT joint Skin Prep: Alcohol. Injectate: 0.5 cc 1% lidocaine plain, 0.5 cc dexamethasone phosphate. Disposition: Patient tolerated procedure well. Injection site dressed with a band-aid.  Return if symptoms worsen or fail to improve.

## 2018-12-16 DIAGNOSIS — E039 Hypothyroidism, unspecified: Secondary | ICD-10-CM | POA: Diagnosis not present

## 2018-12-16 DIAGNOSIS — R0683 Snoring: Secondary | ICD-10-CM | POA: Diagnosis not present

## 2018-12-16 DIAGNOSIS — R030 Elevated blood-pressure reading, without diagnosis of hypertension: Secondary | ICD-10-CM | POA: Diagnosis not present

## 2018-12-16 DIAGNOSIS — R51 Headache: Secondary | ICD-10-CM | POA: Diagnosis not present

## 2019-01-10 DIAGNOSIS — E038 Other specified hypothyroidism: Secondary | ICD-10-CM | POA: Diagnosis not present

## 2019-01-10 DIAGNOSIS — E7849 Other hyperlipidemia: Secondary | ICD-10-CM | POA: Diagnosis not present

## 2019-01-10 DIAGNOSIS — G4709 Other insomnia: Secondary | ICD-10-CM | POA: Diagnosis not present

## 2019-01-10 DIAGNOSIS — Z833 Family history of diabetes mellitus: Secondary | ICD-10-CM | POA: Diagnosis not present

## 2019-01-10 DIAGNOSIS — R002 Palpitations: Secondary | ICD-10-CM | POA: Diagnosis not present

## 2019-01-11 ENCOUNTER — Telehealth (INDEPENDENT_AMBULATORY_CARE_PROVIDER_SITE_OTHER): Payer: Self-pay | Admitting: Orthopaedic Surgery

## 2019-01-11 NOTE — Telephone Encounter (Signed)
Patient called to r/s appt-Asked patient Screening Questions and patient answered ALL no to questions

## 2019-01-12 ENCOUNTER — Other Ambulatory Visit: Payer: Self-pay

## 2019-01-12 ENCOUNTER — Ambulatory Visit (INDEPENDENT_AMBULATORY_CARE_PROVIDER_SITE_OTHER): Payer: BLUE CROSS/BLUE SHIELD

## 2019-01-12 ENCOUNTER — Ambulatory Visit (INDEPENDENT_AMBULATORY_CARE_PROVIDER_SITE_OTHER): Payer: BLUE CROSS/BLUE SHIELD | Admitting: Orthopaedic Surgery

## 2019-01-12 ENCOUNTER — Ambulatory Visit (INDEPENDENT_AMBULATORY_CARE_PROVIDER_SITE_OTHER): Payer: 59 | Admitting: Physician Assistant

## 2019-01-12 VITALS — Ht 66.0 in | Wt 326.0 lb

## 2019-01-12 DIAGNOSIS — M25562 Pain in left knee: Secondary | ICD-10-CM | POA: Diagnosis not present

## 2019-01-12 DIAGNOSIS — M25552 Pain in left hip: Secondary | ICD-10-CM | POA: Diagnosis not present

## 2019-01-12 MED ORDER — NABUMETONE 750 MG PO TABS: 750.0000 mg | ORAL_TABLET | Freq: Two times a day (BID) | ORAL | 1 refills | Status: DC | PRN

## 2019-01-12 MED ORDER — METHYLPREDNISOLONE 4 MG PO TABS: ORAL_TABLET | ORAL | 0 refills | Status: DC

## 2019-01-12 MED ORDER — DICLOFENAC SODIUM 1 % TD GEL: 2.0000 g | Freq: Four times a day (QID) | TRANSDERMAL | 3 refills | Status: DC

## 2019-01-12 NOTE — Progress Notes (Signed)
Office Visit Note   Patient: Theresa Anderson           Date of Birth: 1986/09/08           MRN: [id number] Visit Date: 01/12/2019              Requested by: Donita Brooks, MD [address] [place], [place] [address] PCP: Donita Brooks, MD   Assessment & Plan: Visit Diagnoses:  1. Pain in left hip   2. Left knee pain, unspecified chronicity     Plan: She is a perfect candidate for physical therapy however in light of the coronavirus pandemic we cannot set up therapy as of right now.  I will try a 6-day steroid taper as well as Voltaren gel and Relafen and I would like to see her back in 4 weeks to see how she is doing overall.  If she still symptomatic I would like an AP and lateral of the lumbar spine.  All question concerns were answered and addressed.  Follow-Up Instructions: Return in about 4 weeks (around 02/09/2019).   Orders:  Orders Placed This Encounter  Procedures  . XR HIP UNILAT W OR W/O PELVIS 1V LEFT  . XR Knee 1-2 Views Left   Meds ordered this encounter  Medications  . methylPREDNISolone (MEDROL) 4 MG tablet    Sig: Medrol dose pack. Take as instructed    Dispense:  21 tablet    Refill:  0  . nabumetone (RELAFEN) 750 MG tablet    Sig: Take 1 tablet (750 mg total) by mouth 2 (two) times daily as needed.    Dispense:  60 tablet    Refill:  1  . diclofenac sodium (VOLTAREN) 1 % GEL    Sig: Apply 2 g topically 4 (four) times daily.    Dispense:  100 g    Refill:  3      Procedures: No procedures performed   Clinical Data: No additional findings.   Subjective: Chief Complaint  Patient presents with  . Left Hip - Pain  . Left Knee - Pain  Patient is a very pleasant 33 year old female who comes in for evaluation treatment of about a 5-week history of low back to left-sided hip pain.  She sleeps on that side at night and does not have any pain in her hip on that side at night.  She does not have pain in the groin at all.  She denies any type  of injury.  Is been going on for several weeks and getting slowly worse.  She denies any numbness and tingling in her feet.  She points to the posterior pelvis area and going down the leg is source of her pain but it does not go past her knee to her foot.  She was very active as a younger person but now is not as active as she used to be.  Her BMI is 52.6.  HPI  Review of Systems She currently denies any headache, chest pain, shortness of breath, fever, chills, nausea, vomiting.  Objective: Vital Signs: Ht 5\' 6"  (1.676 m)   Wt (!) 326 lb (147.9 kg)   BMI 52.62 kg/m   Physical Exam She is alert and orient x3 and in no acute distress Ortho Exam Examination of her left hip shows that it moves fluidly and there is no pain in the groin at all.  Examination of her left knee show significant patellofemoral crepitation but at the same that she  has in her other knee.  Both knees hyperextend.  Both knees have slight valgus malalignment when she stands.  Both knees are ligamentously stable and have full range of motion.  I then had her lay on her side with her left side up.  There was no pain to palpation over the greater trochanter or the IT band.  Her pain seems to be around the SI joint in the posterior pelvis area. Specialty Comments:  No specialty comments available.  Imaging: Xr Hip Unilat W Or W/o Pelvis 1v Left  Result Date: 01/12/2019 An AP pelvis and a lateral left hip showed no acute findings.  Due to difficult penetration to the soft tissues of the patient due to her morbid obesity, it is difficult to fully assess the bone.  There does appear to be a maintained joint space.  Xr Knee 1-2 Views Left  Result Date: 01/12/2019 2 views of the left knee show no acute findings.  The alignment is well-maintained.  There is mild patellofemoral arthritic changes.    PMFS History: Patient Active Problem List   Diagnosis Date Noted  . Hypothyroidism 02/10/2017  . Palpitations 02/10/2017  .  PAC (premature atrial contraction) 02/10/2017   Past Medical History:  Diagnosis Date  . Hypothyroidism 02/10/2017  . PAC (premature atrial contraction) 02/10/2017  . Palpitations 02/10/2017    Family History  Problem Relation Age of Onset  . Diabetes Father   . Heart failure Father     Past Surgical History:  Procedure Laterality Date  . CESAREAN SECTION    . CESAREAN SECTION     Social History   Occupational History  . Not on file  Tobacco Use  . Smoking status: Never Smoker  . Smokeless tobacco: Never Used  Substance and Sexual Activity  . Alcohol use: No  . Drug use: No  . Sexual activity: Not on file

## 2019-01-23 ENCOUNTER — Telehealth: Payer: Self-pay | Admitting: *Deleted

## 2019-01-23 ENCOUNTER — Telehealth: Payer: Self-pay | Admitting: Cardiology

## 2019-01-23 DIAGNOSIS — R002 Palpitations: Secondary | ICD-10-CM | POA: Diagnosis not present

## 2019-01-23 DIAGNOSIS — R030 Elevated blood-pressure reading, without diagnosis of hypertension: Secondary | ICD-10-CM | POA: Diagnosis not present

## 2019-01-23 DIAGNOSIS — E785 Hyperlipidemia, unspecified: Secondary | ICD-10-CM | POA: Diagnosis not present

## 2019-01-23 DIAGNOSIS — I491 Atrial premature depolarization: Secondary | ICD-10-CM

## 2019-01-23 DIAGNOSIS — E039 Hypothyroidism, unspecified: Secondary | ICD-10-CM | POA: Diagnosis not present

## 2019-01-23 NOTE — Telephone Encounter (Signed)
Informed patient, she will be enrolled today for [employer] to mail a 14 day ZIO XT long term holter monitor to her home. Instructions briefly reviewed as the will be included in her monitor kit.  Patient asked to change shipping address and address in chart to [address] [place], [place] [address].  We may leave the [place], address in as a confidential address, as this is her mothers home.

## 2019-01-23 NOTE — Telephone Encounter (Signed)
Yes I remember her. I saw her as new pt for post ED f/u for PACs. Dr. Anne Fu was DOD and I discussed with him but he did not physical see her. She was instructed to f/u PRN. Sending to Dr. Eden Emms and Elita Quick to see if he would like to add pt to his schedule. I am also willing to add her on to our schedule if he has nothing available. I think we have openings on Wednesday morning. Thanks

## 2019-01-23 NOTE — Telephone Encounter (Signed)
Can put on for video visit this week Needs 2 week Zio Patch placed for palpitations and history of PAC;s not afib

## 2019-01-23 NOTE — Progress Notes (Signed)
Virtual Visit via Video Note   This visit type was conducted due to national recommendations for restrictions regarding the COVID-19 Pandemic (e.g. social distancing) in an effort to limit this patient's exposure and mitigate transmission in our community.  Due to her co-morbid illnesses, this patient is at least at moderate risk for complications without adequate follow up.  This format is felt to be most appropriate for this patient at this time.  All issues noted in this document were discussed and addressed.  A limited physical exam was performed with this format.  Please refer to the patient's chart for her consent to telehealth for [facility].   Evaluation Performed:  Follow-up visit  Date:  01/26/2019   ID:  Theresa Anderson, DOB 06/05/1986, MRN [id number]  Patient Location: Home   Provider Location: Office  PCP:  Donita BrooksPickard, Warren T, MD  Cardiologist:  No primary care provider on file. [name] Electrophysiologist:  None   Chief Complaint:  Palpitations   History of Present Illness:    Theresa Anderson is a 33 y.o. female who presents via audio/video conferencing for a telehealth visit today.    Seen by PA 02/12/17 with PAC;s I have seen both parents Dad had CHF and has passed mom has valve disease Seen in ED 01/2017 with PAC;s TSH ws 8.4 normal T4 ECG nonspecific ST changes normal QT no arrhythmia Little caffeine rare ETOH Non smoker Obese weight over 300 lbs  Called office 01/23/19 with palpitations has not had sleep study. 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    Past Medical History:  Diagnosis Date  . Hypothyroidism 02/10/2017  . PAC (premature atrial contraction) 02/10/2017  . Palpitations 02/10/2017   Past Surgical History:  Procedure Laterality Date  . CESAREAN SECTION    . CESAREAN SECTION       Current Meds  Medication Sig  . diclofenac sodium (VOLTAREN) 1 % GEL Apply 2 g topically 4 (four) times daily.  Marland Kitchen. levothyroxine (SYNTHROID, LEVOTHROID) 100 MCG tablet Take 100 mcg by mouth daily before breakfast.  . methocarbamol (ROBAXIN) 500 MG tablet   . nabumetone (RELAFEN) 750 MG tablet Take 1 tablet (750 mg total) by mouth 2 (two) times daily as needed.     Allergies:   Amoxicillin   Social History   Tobacco Use  . Smoking status: Never Smoker  . Smokeless tobacco: Never Used  Substance Use Topics  . Alcohol use: No  . Drug use: No     Family Hx: The patient's family history includes Diabetes in her father; Heart failure in her father.  ROS:   Please see the history of present illness.     All other systems reviewed and are negative.   Prior CV studies:   The following studies were reviewed today:  Notes PA 2018 and ER visit with labs and ECG   Labs/Other Tests and Data Reviewed:    EKG:  SR nonspecific ST changes 02/01/17  Recent Labs: No results found for requested labs within last 8760 hours.   Recent Lipid Panel No results found for: CHOL, TRIG, HDL, CHOLHDL, LDLCALC, LDLDIRECT  Wt Readings from Last 3 Encounters:  01/26/19 (!) 149.7 kg  01/12/19 (!) 147.9 kg  04/20/17 (!) 147.9 kg     Objective:    Vital Signs:  BP 120/68   Pulse 79   Ht 5\' 6"  (1.676 m)   Wt (!) 149.7 kg   BMI 53.26 kg/m    Overweight female  in no distress No tachypnea  JVP not elevated Skin warm and dry  No edema    ASSESSMENT & PLAN:    1. Palpitations: continue with 2 week Zio Patch. Needs f/u sleep study  PRN inderal. Repeat labs for thyroid Echo to r/o structural heart disease when COVID restrictions gone  2.  Thyroid:  On replacement TSH normalized 04/20/17 1.8   COVID-19 Education: The signs and symptoms of COVID-19 were discussed with the patient and how to seek care for testing (follow up with PCP or arrange E-visit).  The importance of social distancing was discussed today.  Time:   Today, I have spent 30 minutes with the patient with telehealth technology discussing the above problems.     Medication Adjustments/Labs and Tests Ordered: Current medicines are reviewed at length with the patient today.  Concerns regarding medicines are outlined above.  Tests Ordered:  Echo, Sleep study and Zio Patch  Medication Changes: Meds ordered this encounter  Medications  . propranolol (INDERAL) 10 MG tablet    Sig: Take 1 tablet (10 mg total) by mouth daily as needed (palpitations).    Dispense:  30 tablet    Refill:  11    Disposition:  Follow up F/U in a year pending Echo and Zio Patch monitor   Signed, Charlton Haws, MD  01/26/2019 10:35 AM    [facility]

## 2019-01-23 NOTE — Telephone Encounter (Signed)
° °  Patient c/o Palpitations:  High priority if patient c/o lightheadedness, shortness of breath, or chest pain  1) How long have you had palpitations/irregular HR/ Afib? Are you having the symptoms now? 4 DAYS  2) Are you currently experiencing lightheadedness, SOB or CP? NO  3) Do you have a history of afib (atrial fibrillation) or irregular heart rhythm? NO  4) Have you checked your BP or HR? (document readings if available): HR 80  5) Are you experiencing any other symptoms? HEADACHE

## 2019-01-23 NOTE — Telephone Encounter (Signed)
Patient called to inform us that she is having palpitations that seem to have gotten worse lately over the past week.  She last saw Robbie Lis in April of 2018 and had palpitations at that time, trouble locating primary cardiologist.    Lately these feel different than in the past.  They feel like a "thud, hard beat, butterflies".  It is much more significant at night when she lays down and she is not sleeping well.  Her HR usually is around 60 daytime, but in the evening it increases to 80s.  Her BP has remained stable, no numbers available, but 110s/70s, she reports.  She also is having headaches when she feels palpitations.  She has no CP or SOB symptoms.    Her parents see Dr Eden Emms and would like to get established with him.

## 2019-01-23 NOTE — Telephone Encounter (Signed)
Called patient back. Informed patient of Dr. Fabio Bering recommendations. Patient agreed to virtual visit on Thursday and will send message to monitor techs about contacting patient about zio patch.     Virtual Visit Pre-Appointment Phone Call  Steps For Call:  1. Confirm consent - "In the setting of the current Covid19 crisis, you are scheduled for a (phone or video) visit with your provider on (date) at (time).  Just as we do with many in-office visits, in order for you to participate in this visit, we must obtain consent.  If you'd like, I can send this to your mychart (if signed up) or email for you to review.  Otherwise, I can obtain your verbal consent now.  All virtual visits are billed to your insurance company just like a normal visit would be.  By agreeing to a virtual visit, we'd like you to understand that the technology does not allow for your provider to perform an examination, and thus may limit your provider's ability to fully assess your condition.  Finally, though the technology is pretty good, we cannot assure that it will always work on either your or our end, and in the setting of a video visit, we may have to convert it to a phone-only visit.  In either situation, we cannot ensure that we have a secure connection.  Are you willing to proceed?"  2. Give patient instructions for WebEx download to smartphone as below if video visit  3. Advise patient to be prepared with any vital sign or heart rhythm information, their current medicines, and a piece of paper and pen handy for any instructions they may receive the day of their visit  4. Inform patient they will receive a phone call 15 minutes prior to their appointment time (may be from unknown caller ID) so they should be prepared to answer  5. Confirm that appointment type is correct in Epic appointment notes (video vs telephone)    TELEPHONE CALL NOTE  Theresa Anderson has been deemed a candidate for a follow-up tele-health visit  to limit community exposure during the Covid-19 pandemic. I spoke with the patient via phone to ensure availability of phone/video source, confirm preferred email & phone number, and discuss instructions and expectations.  I reminded Theresa Anderson to be prepared with any vital sign and/or heart rhythm information that could potentially be obtained via home monitoring, at the time of her visit. I reminded Theresa Anderson to expect a phone call at the time of her visit if her visit.  Did the patient verbally acknowledge consent to treatment? Yes  Theresa Chick, RN 01/23/2019 11:18 AM   DOWNLOADING THE WEBEX SOFTWARE TO SMARTPHONE  - If Apple, go to [employer] and type in WebEx in the search bar. Download [employer], the blue/green circle. The app is free but as with any other app downloads, their phone may require them to verify saved payment information or Apple password. The patient does NOT have to create an account.  - If Android, ask patient to go to [employer] and type in WebEx in the search bar. Download [employer], the blue/green circle. The app is free but as with any other app downloads, their phone may require them to verify saved payment information or Android password. The patient does NOT have to create an account.   CONSENT FOR TELE-HEALTH VISIT - PLEASE REVIEW  I hereby voluntarily request, consent and authorize [facility] and its employed or contracted physicians, physician  assistants, nurse practitioners or other licensed health care professionals (the Practitioner), to provide me with telemedicine health care services (the "Services") as deemed necessary by the treating Practitioner. I acknowledge and consent to receive the Services by the Practitioner via telemedicine. I understand that the telemedicine visit will involve communicating with the Practitioner through live audiovisual communication technology and the disclosure of certain medical  information by electronic transmission. I acknowledge that I have been given the opportunity to request an in-person assessment or other available alternative prior to the telemedicine visit and am voluntarily participating in the telemedicine visit.  I understand that I have the right to withhold or withdraw my consent to the use of telemedicine in the course of my care at any time, without affecting my right to future care or treatment, and that the Practitioner or I may terminate the telemedicine visit at any time. I understand that I have the right to inspect all information obtained and/or recorded in the course of the telemedicine visit and may receive copies of available information for a reasonable fee.  I understand that some of the potential risks of receiving the Services via telemedicine include:  Marland Kitchen Delay or interruption in medical evaluation due to technological equipment failure or disruption; . Information transmitted may not be sufficient (e.g. poor resolution of images) to allow for appropriate medical decision making by the Practitioner; and/or  . In rare instances, security protocols could fail, causing a breach of personal health information.  Furthermore, I acknowledge that it is my responsibility to provide information about my medical history, conditions and care that is complete and accurate to the best of my ability. I acknowledge that Practitioner's advice, recommendations, and/or decision may be based on factors not within their control, such as incomplete or inaccurate data provided by me or distortions of diagnostic images or specimens that may result from electronic transmissions. I understand that the practice of medicine is not an exact science and that Practitioner makes no warranties or guarantees regarding treatment outcomes. I acknowledge that I will receive a copy of this consent concurrently upon execution via email to the email address I last provided but may also  request a printed copy by calling the office of [facility].    I understand that my insurance will be billed for this visit.   I have read or had this consent read to me. . I understand the contents of this consent, which adequately explains the benefits and risks of the Services being provided via telemedicine.  . I have been provided ample opportunity to ask questions regarding this consent and the Services and have had my questions answered to my satisfaction. . I give my informed consent for the services to be provided through the use of telemedicine in my medical care  By participating in this telemedicine visit I agree to the above.

## 2019-01-25 NOTE — Telephone Encounter (Signed)
Follow up      Patient has webex downloaded. Message to [facility] to mail monitor to pts home.

## 2019-01-26 ENCOUNTER — Other Ambulatory Visit: Payer: Self-pay

## 2019-01-26 ENCOUNTER — Ambulatory Visit (INDEPENDENT_AMBULATORY_CARE_PROVIDER_SITE_OTHER): Payer: BLUE CROSS/BLUE SHIELD

## 2019-01-26 ENCOUNTER — Encounter: Payer: Self-pay | Admitting: Cardiovascular Disease

## 2019-01-26 ENCOUNTER — Telehealth (INDEPENDENT_AMBULATORY_CARE_PROVIDER_SITE_OTHER): Payer: BLUE CROSS/BLUE SHIELD | Admitting: Cardiovascular Disease

## 2019-01-26 VITALS — BP 120/68 | HR 79 | Ht 66.0 in | Wt 330.0 lb

## 2019-01-26 DIAGNOSIS — I491 Atrial premature depolarization: Secondary | ICD-10-CM | POA: Diagnosis not present

## 2019-01-26 DIAGNOSIS — R002 Palpitations: Secondary | ICD-10-CM

## 2019-01-26 MED ORDER — PROPRANOLOL HCL 10 MG PO TABS: 10.0000 mg | ORAL_TABLET | Freq: Every day | ORAL | 11 refills | Status: DC | PRN

## 2019-01-26 NOTE — Patient Instructions (Addendum)
Medication Instructions:  Your physician has recommended you make the following change in your medication:   1-Take Inderal 10 mg by mouth as needed for palpitations.  If you need a refill on your cardiac medications before your next appointment, please call your pharmacy.   Lab work:  If you have labs (blood work) drawn today and your tests are completely normal, you will receive your results only by: Marland Kitchen MyChart Message (if you have MyChart) OR . A paper copy in the mail If you have any lab test that is abnormal or we need to change your treatment, we will call you to review the results.  Testing/Procedures: Your physician has requested that you have an echocardiogram. Echocardiography is a painless test that uses sound waves to create images of your heart. It provides your doctor with information about the size and shape of your heart and how well your heart's chambers and valves are working. This procedure takes approximately one hour. There are no restrictions for this procedure.  Follow-Up: At [facility], you and your health needs are our priority.  As part of our continuing mission to provide you with exceptional heart care, we have created designated Provider Care Teams.  These Care Teams include your primary Cardiologist (physician) and Advanced Practice Providers (APPs -  Physician Assistants and Nurse Practitioners) who all work together to provide you with the care you need, when you need it. You will need a follow up appointment in July.  You may see Dr. Eden Emms or one of the following Advanced Practice Providers on your designated Care Team:   Norma Fredrickson, NP Nada Boozer, NP . Georgie Chard, NP

## 2019-02-09 ENCOUNTER — Ambulatory Visit (INDEPENDENT_AMBULATORY_CARE_PROVIDER_SITE_OTHER): Payer: BLUE CROSS/BLUE SHIELD | Admitting: Orthopaedic Surgery

## 2019-02-16 ENCOUNTER — Other Ambulatory Visit: Payer: Self-pay

## 2019-02-16 DIAGNOSIS — I491 Atrial premature depolarization: Secondary | ICD-10-CM | POA: Diagnosis not present

## 2019-02-16 DIAGNOSIS — R002 Palpitations: Secondary | ICD-10-CM | POA: Diagnosis not present

## 2019-02-28 ENCOUNTER — Ambulatory Visit: Payer: Self-pay | Admitting: Orthopaedic Surgery

## 2019-03-16 ENCOUNTER — Ambulatory Visit: Payer: BLUE CROSS/BLUE SHIELD | Admitting: Orthopaedic Surgery

## 2019-03-19 ENCOUNTER — Telehealth: Payer: Self-pay | Admitting: Cardiology

## 2019-03-19 NOTE — Telephone Encounter (Signed)
Attempted to call patient, no answer, mailbox full.  Call was in regards to recent office visit 5/28 possible Covid exposure and offer testing.

## 2019-05-09 ENCOUNTER — Other Ambulatory Visit: Payer: Self-pay

## 2019-05-09 ENCOUNTER — Ambulatory Visit (HOSPITAL_COMMUNITY): Payer: BC Managed Care – PPO | Attending: Cardiology

## 2019-05-09 DIAGNOSIS — R002 Palpitations: Secondary | ICD-10-CM | POA: Insufficient documentation

## 2019-05-18 ENCOUNTER — Ambulatory Visit: Payer: BC Managed Care – PPO | Admitting: Cardiovascular Disease

## 2019-07-07 NOTE — Progress Notes (Deleted)
Evaluation Performed:  Follow-up visit  Date:  07/07/2019   ID:  Theresa DavidsonHeather Anderson, DOB 01/11/1986, MRN [id number]  Provider Location: Office  PCP:  Theresa Anderson  Cardiologist:  No primary care provider on file. Theresa Anderson Electrophysiologist:  None   Chief Complaint:  Palpitations   History of Present Illness:    Theresa DavidsonHeather Ing is a 33 y.o. female f/u palpitations    Seen by PA 02/12/17 with PAC;s I have seen both parents Dad had CHF and has passed mom has valve disease Seen in ED 01/2017 with PAC;s TSH ws 8.4 normal T4 ECG nonspecific ST changes normal QT no arrhythmia Little caffeine rare ETOH Non smoker Obese weight over 300 lbs  Called office 01/23/19 with palpitations has not had sleep study. 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    Past Medical History:  Diagnosis Date  . Hypothyroidism 02/10/2017  . PAC (premature atrial contraction) 02/10/2017  . Palpitations 02/10/2017   Past Surgical History:  Procedure Laterality Date  . CESAREAN SECTION    . CESAREAN SECTION       No outpatient medications have been marked as taking for the 07/19/19 encounter (Appointment) with Theresa Anderson, Theresa Basso Anderson, Anderson.     Allergies:   Amoxicillin   Social History   Tobacco Use  . Smoking status: Never Smoker  . Smokeless  tobacco: Never Used  Substance Use Topics  . Alcohol use: No  . Drug use: No     Family Hx: The patient's family history includes Diabetes in her father; Heart failure in her father.  ROS:   Please see the history of present illness.     All other systems reviewed and are negative.   Prior CV studies:   The following studies were reviewed today: Holter 02/16/19 Echo 05/09/19    Labs/Other Tests and Data Reviewed:    EKG:   SR nonspecific ST changes 02/01/17  Recent Labs: No results found for requested labs within last 8760 hours.   Recent Lipid Panel No results found for: CHOL, TRIG, HDL, CHOLHDL, LDLCALC, LDLDIRECT  Wt Readings from Last 3 Encounters:  01/26/19 (!) 330 lb (149.7 kg)  01/12/19 (!) 326 lb (147.9 kg)  04/20/17 (!) 326 lb (147.9 kg)     Objective:    Vital Signs:  There were no vitals taken for this visit.   Affect appropriate Overweight white female  HEENT: normal Neck supple with no adenopathy JVP normal no bruits no thyromegaly Lungs clear with no wheezing and good diaphragmatic motion Heart:  S1/S2 no murmur, no rub, gallop or click PMI normal Abdomen: benighn, BS positve, no tenderness, no AAA no bruit.  No HSM or HJR Distal pulses intact with no bruits No edema Neuro non-focal Skin warm and  dry No muscular weakness    ASSESSMENT & PLAN:    1. Palpitations: *** 2.  Thyroid:  On replacement TSH normalized 04/20/17 1.8   COVID-19 Education: The signs and symptoms of COVID-19 were discussed with the patient and how to seek care for testing (follow up with PCP or arrange E-visit).  The importance of social distancing was discussed today.  Time:   Today, I have spent 30 minutes with the patient with telehealth technology discussing the above problems.     Medication Adjustments/Labs and Tests Ordered: Current medicines are reviewed at length with the patient today.  Concerns regarding medicines are outlined above.  Tests Ordered:   Echo, Sleep study and Zio Patch  Medication Changes: No orders of the defined types were placed in this encounter.   Disposition:  Follow up F/U in a year pending Echo and Zio Patch monitor   Signed, Theresa Rouge, Anderson  07/07/2019 5:20 PM    [facility]

## 2019-07-19 ENCOUNTER — Ambulatory Visit: Payer: BC Managed Care – PPO | Admitting: Cardiovascular Disease

## 2019-08-04 ENCOUNTER — Ambulatory Visit (INDEPENDENT_AMBULATORY_CARE_PROVIDER_SITE_OTHER): Payer: BC Managed Care – PPO | Admitting: Podiatry

## 2019-08-04 ENCOUNTER — Other Ambulatory Visit: Payer: Self-pay

## 2019-08-04 DIAGNOSIS — M19072 Primary osteoarthritis, left ankle and foot: Secondary | ICD-10-CM | POA: Diagnosis not present

## 2019-08-04 DIAGNOSIS — M19071 Primary osteoarthritis, right ankle and foot: Secondary | ICD-10-CM

## 2019-08-04 DIAGNOSIS — M19079 Primary osteoarthritis, unspecified ankle and foot: Secondary | ICD-10-CM

## 2019-09-01 ENCOUNTER — Encounter: Payer: Self-pay | Admitting: Podiatry

## 2019-09-07 ENCOUNTER — Other Ambulatory Visit: Payer: Self-pay | Admitting: Endocrinology

## 2019-09-07 DIAGNOSIS — N644 Mastodynia: Secondary | ICD-10-CM

## 2019-09-18 ENCOUNTER — Other Ambulatory Visit: Payer: Self-pay

## 2019-09-18 ENCOUNTER — Ambulatory Visit
Admission: RE | Admit: 2019-09-18 | Discharge: 2019-09-18 | Disposition: A | Discharge status: Home / Self Care | Payer: BC Managed Care – PPO | Source: Ambulatory Visit | Attending: Endocrinology | Admitting: Endocrinology

## 2019-09-18 DIAGNOSIS — N6489 Other specified disorders of breast: Secondary | ICD-10-CM | POA: Diagnosis not present

## 2019-09-18 DIAGNOSIS — N644 Mastodynia: Secondary | ICD-10-CM

## 2019-09-18 DIAGNOSIS — R928 Other abnormal and inconclusive findings on diagnostic imaging of breast: Secondary | ICD-10-CM | POA: Diagnosis not present

## 2019-09-22 ENCOUNTER — Ambulatory Visit: Payer: BC Managed Care – PPO | Admitting: Podiatry

## 2019-10-06 ENCOUNTER — Ambulatory Visit (INDEPENDENT_AMBULATORY_CARE_PROVIDER_SITE_OTHER): Payer: BC Managed Care – PPO | Admitting: Podiatry

## 2019-10-06 ENCOUNTER — Other Ambulatory Visit: Payer: Self-pay

## 2019-10-06 DIAGNOSIS — J020 Streptococcal pharyngitis: Secondary | ICD-10-CM | POA: Insufficient documentation

## 2019-10-06 DIAGNOSIS — M19072 Primary osteoarthritis, left ankle and foot: Secondary | ICD-10-CM

## 2019-10-06 DIAGNOSIS — M19079 Primary osteoarthritis, unspecified ankle and foot: Secondary | ICD-10-CM

## 2019-10-06 DIAGNOSIS — R509 Fever, unspecified: Secondary | ICD-10-CM | POA: Insufficient documentation

## 2019-10-06 DIAGNOSIS — J0140 Acute pansinusitis, unspecified: Secondary | ICD-10-CM | POA: Insufficient documentation

## 2019-10-06 DIAGNOSIS — J029 Acute pharyngitis, unspecified: Secondary | ICD-10-CM | POA: Insufficient documentation

## 2019-10-06 NOTE — Progress Notes (Signed)
  Subjective:  Patient ID: Theresa Anderson, female    DOB: Mar 04, 1986,  MRN: [id number]  Chief Complaint  Patient presents with  . Arthritis    Pt states right foot is feeling better but her left is still painful. Pt states left dorsal foot pain/swelling/discoloration which occurred 3 weeks ago and lasted for 3 days, no known injuries. Pt states this has since resolved.    33 y.o. female presents with the above complaint. States the injections at last visit helped on the R but not as much on the left.   Review of Systems: Negative except as noted in the HPI. Denies N/V/F/Ch.  Past Medical History:  Diagnosis Date  . Hypothyroidism 02/10/2017  . PAC (premature atrial contraction) 02/10/2017  . Palpitations 02/10/2017    Current Outpatient Medications:  .  levothyroxine (SYNTHROID, LEVOTHROID) 100 MCG tablet, Take 100 mcg by mouth daily before breakfast., Disp: , Rfl:  .  methocarbamol (ROBAXIN) 500 MG tablet, , Disp: , Rfl:  .  nabumetone (RELAFEN) 750 MG tablet, Take 1 tablet (750 mg total) by mouth 2 (two) times daily as needed., Disp: 60 tablet, Rfl: 1 .  propranolol (INDERAL) 10 MG tablet, Take 1 tablet (10 mg total) by mouth daily as needed (palpitations)., Disp: 30 tablet, Rfl: 11 .  diclofenac sodium (VOLTAREN) 1 % GEL, Apply 2 g topically 4 (four) times daily., Disp: 100 g, Rfl: 3  Social History   Tobacco Use  Smoking Status Never Smoker  Smokeless Tobacco Never Used    Allergies  Allergen Reactions  . Amoxicillin Anxiety    Hives   Objective:  There were no vitals filed for this visit. There is no height or weight on file to calculate BMI. Constitutional Well developed. Well nourished.  Vascular Dorsalis pedis pulses palpable bilaterally. Posterior tibial pulses palpable bilaterally. Capillary refill normal to all digits.  No cyanosis or clubbing noted. Pedal hair growth normal.  Neurologic Normal speech. Oriented to person, place, and time. Epicritic sensation to  light touch grossly present bilaterally.  Dermatologic Nails well groomed and normal in appearance. No open wounds. No skin lesions.  Orthopedic: POP L dorsal midfoot about the 2nd TMT   Radiographs: None Assessment:   1. Arthritis of midfoot    Plan:  Patient was evaluated and treated and all questions answered.  Arthritis 2nd TMT left -Repeat injection as below  Procedure: Joint Injection Location: Left 2nd TMT joint Skin Prep: Alcohol. Injectate: 0.5 cc 1% lidocaine plain, 0.5 cc dexamethasone phosphate. Disposition: Patient tolerated procedure well. Injection site dressed with a band-aid.    No follow-ups on file.

## 2019-10-06 NOTE — Progress Notes (Signed)
  Subjective:  Patient ID: Theresa Anderson, female    DOB: 05-Feb-1986,  MRN: [id number]  No chief complaint on file.  33 y.o. female presents with continued arthritis pains to the top of the feet.   Review of Systems: Negative except as noted in the HPI. Denies N/V/F/Ch.  Past Medical History:  Diagnosis Date  . Hypothyroidism 02/10/2017  . PAC (premature atrial contraction) 02/10/2017  . Palpitations 02/10/2017    Current Outpatient Medications:  .  diclofenac sodium (VOLTAREN) 1 % GEL, Apply 2 g topically 4 (four) times daily., Disp: 100 g, Rfl: 3 .  levothyroxine (SYNTHROID, LEVOTHROID) 100 MCG tablet, Take 100 mcg by mouth daily before breakfast., Disp: , Rfl:  .  methocarbamol (ROBAXIN) 500 MG tablet, , Disp: , Rfl:  .  nabumetone (RELAFEN) 750 MG tablet, Take 1 tablet (750 mg total) by mouth 2 (two) times daily as needed., Disp: 60 tablet, Rfl: 1 .  propranolol (INDERAL) 10 MG tablet, Take 1 tablet (10 mg total) by mouth daily as needed (palpitations)., Disp: 30 tablet, Rfl: 11  Social History   Tobacco Use  Smoking Status Never Smoker  Smokeless Tobacco Never Used    Allergies  Allergen Reactions  . Amoxicillin Anxiety    Hives   Objective:  There were no vitals filed for this visit. There is no height or weight on file to calculate BMI. Constitutional Well developed. Well nourished.  Vascular Dorsalis pedis pulses palpable bilaterally. Posterior tibial pulses palpable bilaterally. Capillary refill normal to all digits.  No cyanosis or clubbing noted. Pedal hair growth normal.  Neurologic Normal speech. Oriented to person, place, and time. Epicritic sensation to light touch grossly present bilaterally.  Dermatologic Nails well groomed and normal in appearance. No open wounds. No skin lesions.  Orthopedic: POP dorsal TMT bilat   Radiographs: none Assessment:   1. Arthritis of midfoot    Plan:  Patient was evaluated and treated and all questions  answered.  Midfoot arthritis bilat -Injections delivered as below  Procedure: Joint Injection Location: Bilateral 2nd TMT joint Skin Prep: Alcohol. Injectate: 0.5 cc 1% lidocaine plain, 0.5 cc dexamethasone phosphate. Disposition: Patient tolerated procedure well. Injection site dressed with a band-aid.    No follow-ups on file.

## 2019-11-17 ENCOUNTER — Ambulatory Visit (INDEPENDENT_AMBULATORY_CARE_PROVIDER_SITE_OTHER): Payer: BC Managed Care – PPO | Admitting: Podiatry

## 2019-11-17 DIAGNOSIS — M19079 Primary osteoarthritis, unspecified ankle and foot: Secondary | ICD-10-CM

## 2019-11-17 DIAGNOSIS — M7750 Other enthesopathy of unspecified foot: Secondary | ICD-10-CM

## 2019-11-17 NOTE — Progress Notes (Signed)
No show for appt. 

## 2019-11-29 DIAGNOSIS — J019 Acute sinusitis, unspecified: Secondary | ICD-10-CM | POA: Diagnosis not present

## 2019-12-11 ENCOUNTER — Other Ambulatory Visit (INDEPENDENT_AMBULATORY_CARE_PROVIDER_SITE_OTHER): Payer: Self-pay | Admitting: Orthopaedic Surgery

## 2020-01-15 DIAGNOSIS — E7849 Other hyperlipidemia: Secondary | ICD-10-CM | POA: Diagnosis not present

## 2020-01-15 DIAGNOSIS — E038 Other specified hypothyroidism: Secondary | ICD-10-CM | POA: Diagnosis not present

## 2020-01-15 DIAGNOSIS — R002 Palpitations: Secondary | ICD-10-CM | POA: Diagnosis not present

## 2020-03-12 DIAGNOSIS — Z124 Encounter for screening for malignant neoplasm of cervix: Secondary | ICD-10-CM | POA: Diagnosis not present

## 2020-03-12 DIAGNOSIS — N644 Mastodynia: Secondary | ICD-10-CM | POA: Diagnosis not present

## 2020-03-12 DIAGNOSIS — Z01419 Encounter for gynecological examination (general) (routine) without abnormal findings: Secondary | ICD-10-CM | POA: Diagnosis not present

## 2020-03-12 DIAGNOSIS — G43829 Menstrual migraine, not intractable, without status migrainosus: Secondary | ICD-10-CM | POA: Diagnosis not present

## 2020-03-14 ENCOUNTER — Other Ambulatory Visit: Payer: Self-pay | Admitting: Cardiovascular Disease

## 2020-03-22 ENCOUNTER — Telehealth: Payer: Self-pay | Admitting: Podiatry

## 2020-03-22 NOTE — Telephone Encounter (Signed)
Left voicemail for patient to call back and sched appt per pts MyChart message.

## 2020-04-17 ENCOUNTER — Other Ambulatory Visit: Payer: Self-pay | Admitting: Cardiovascular Disease

## 2020-05-23 ENCOUNTER — Other Ambulatory Visit: Payer: Self-pay | Admitting: Cardiovascular Disease

## 2020-06-04 ENCOUNTER — Other Ambulatory Visit: Payer: Self-pay | Admitting: Cardiovascular Disease

## 2020-09-13 DIAGNOSIS — R430 Anosmia: Secondary | ICD-10-CM | POA: Diagnosis not present

## 2020-09-13 DIAGNOSIS — Z20828 Contact with and (suspected) exposure to other viral communicable diseases: Secondary | ICD-10-CM | POA: Diagnosis not present

## 2021-01-16 DIAGNOSIS — E785 Hyperlipidemia, unspecified: Secondary | ICD-10-CM | POA: Diagnosis not present

## 2021-01-16 DIAGNOSIS — E039 Hypothyroidism, unspecified: Secondary | ICD-10-CM | POA: Diagnosis not present

## 2021-01-21 DIAGNOSIS — E039 Hypothyroidism, unspecified: Secondary | ICD-10-CM | POA: Diagnosis not present

## 2021-03-30 NOTE — Progress Notes (Signed)
CARDIOLOGY CONSULT NOTE      Virtual Visit via Video Note   This visit type was conducted due to national recommendations for restrictions regarding the COVID-19 Pandemic (e.g. social distancing) in an effort to limit this patient's exposure and mitigate transmission in our community.  Due to her co-morbid illnesses, this patient is at least at moderate risk for complications without adequate follow up.  This format is felt to be most appropriate for this patient at this time.  All issues noted in this document were discussed and addressed.  A limited physical exam was performed with this format.  Please refer to the patient's chart for her consent to telehealth for [facility].   Patient location: Home Physician location: Office  Patient ID: Theresa Anderson MRN: [id number] DOB/AGE: Oct 01, 1986 34 y.o.  Referring Physician: Tanya Nones Primary Physician: Donita Brooks, MD Primary Cardiologist: Eden Emms     HPI:  35 y.o. with history of palpitations and PAC;s. I use to care for her dad with CHF and mom has valvular disease. 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 as her husband is a Company secretary Has one Son in 6 grade He is doing better at [employer] school in [place] He was struggling in public school   ROS All other systems reviewed and negative except as noted above  Past Medical History:  Diagnosis Date   Hypothyroidism 02/10/2017   PAC (premature atrial contraction)  02/10/2017   Palpitations 02/10/2017    Family History  Problem Relation Age of Onset   Diabetes Father    Heart failure Father     Social History   Socioeconomic History   Marital status: Single    Spouse name: Not on file   Number of children: Not on file   Years of education: Not on file   Highest education level: Not on file  Occupational History   Not on file  Tobacco Use   Smoking status: Never   Smokeless tobacco: Never  Substance and Sexual Activity   Alcohol use: No   Drug use: No   Sexual activity: Not on file  Other Topics Concern   Not on file  Social History Narrative   Not on file   Social Determinants of Health   Financial Resource Strain: Not on file  Food Insecurity: Not on file  Transportation Needs: Not on file  Physical Activity: Not on file  Stress: Not on file  Social Connections: Not on file  Intimate Partner Violence: Not on file    Past Surgical History:  Procedure Laterality Date   CESAREAN SECTION     CESAREAN SECTION        Current Outpatient Medications:    levothyroxine (SYNTHROID, LEVOTHROID) 100 MCG tablet, Take 100 mcg by mouth daily before breakfast., Disp: , Rfl:    methocarbamol (ROBAXIN) 500 MG tablet, , Disp: , Rfl:    nabumetone (RELAFEN) 750 MG tablet, TAKE 1 TABLET(750 MG) BY MOUTH TWICE DAILY AS  NEEDED, Disp: 60 tablet, Rfl: 1   propranolol (INDERAL) 10 MG tablet, Take 1 tablet (10 mg total) by mouth daily. Please schedule appt for future refills. 2nd attempt., Disp: 15 tablet, Rfl: 0    Physical Exam: Height 5\' 6"  (1.676 m), weight (!) 149.7 kg.    No distress No JVP elevation No tachypnea No edema Overweight   Labs:   Lab Results  Component Value Date   WBC 9.7 04/20/2017   HGB 12.6 04/20/2017   HCT 38.7 04/20/2017   MCV 82.2 04/20/2017   PLT 344 04/20/2017   No results for input(s): NA, K, CL, CO2, BUN, CREATININE, CALCIUM, PROT, BILITOT, ALKPHOS, ALT, AST, GLUCOSE in the last 168 hours.  Invalid  input(s): LABALBU Lab Results  Component Value Date   TROPONINI <0.03 02/01/2017     Radiology: No results found.  EKG: 2018 SR rate 88 poor R wave progression nonspecific ST changes   ASSESSMENT AND PLAN:   Palpitations:  Benign sounding ECG with normal QT, no pre excitation TTE 05/10/19 normal. Monitor 02/16/19 benign rare PAC/PVCls no significant arrhythmias Will update monitor Have seen some exacerbation and PAF in patients after vaccine  Thyroid :  on synthroid replacement TSH have been normal f/u labs with primary  Fatigue/Dyspnea:  seems functional worse with her palpitations will update echo r/o any signs of pericarditis post vaccine   14 day monitor TTE F/U PRN if normal   Time : spent reviewing chart, TTE, monitor , ECG direct patient interview and composing note 30 minutes    Signed: 02/18/19 04/04/2021, 9:10 AM

## 2021-04-04 ENCOUNTER — Encounter: Payer: Self-pay | Admitting: Cardiovascular Disease

## 2021-04-04 ENCOUNTER — Telehealth (INDEPENDENT_AMBULATORY_CARE_PROVIDER_SITE_OTHER): Payer: BC Managed Care – PPO | Admitting: Cardiovascular Disease

## 2021-04-04 ENCOUNTER — Ambulatory Visit (INDEPENDENT_AMBULATORY_CARE_PROVIDER_SITE_OTHER): Payer: BC Managed Care – PPO

## 2021-04-04 ENCOUNTER — Other Ambulatory Visit: Payer: Self-pay

## 2021-04-04 ENCOUNTER — Ambulatory Visit: Payer: BC Managed Care – PPO | Admitting: Cardiovascular Disease

## 2021-04-04 ENCOUNTER — Ambulatory Visit: Payer: BC Managed Care – PPO | Admitting: Podiatry

## 2021-04-04 VITALS — Ht 66.0 in | Wt 330.0 lb

## 2021-04-04 DIAGNOSIS — R001 Bradycardia, unspecified: Secondary | ICD-10-CM

## 2021-04-04 DIAGNOSIS — R002 Palpitations: Secondary | ICD-10-CM | POA: Diagnosis not present

## 2021-04-04 DIAGNOSIS — R0600 Dyspnea, unspecified: Secondary | ICD-10-CM

## 2021-04-04 DIAGNOSIS — E039 Hypothyroidism, unspecified: Secondary | ICD-10-CM

## 2021-04-04 DIAGNOSIS — R0609 Other forms of dyspnea: Secondary | ICD-10-CM

## 2021-04-04 DIAGNOSIS — M19071 Primary osteoarthritis, right ankle and foot: Secondary | ICD-10-CM

## 2021-04-04 DIAGNOSIS — M19072 Primary osteoarthritis, left ankle and foot: Secondary | ICD-10-CM

## 2021-04-04 DIAGNOSIS — R5383 Other fatigue: Secondary | ICD-10-CM

## 2021-04-04 NOTE — Progress Notes (Unsigned)
Enrolled patient for a 14 day Zio XT  monitor to be mailed to patients home  °

## 2021-04-04 NOTE — Patient Instructions (Signed)
Medication Instructions:  Your physician recommends that you continue on your current medications as directed. Please refer to the Current Medication list given to you today.  *If you need a refill on your cardiac medications before your next appointment, please call your pharmacy*   Lab Work: NONE If you have labs (blood work) drawn today and your tests are completely normal, you will receive your results only by: MyChart Message (if you have MyChart) OR A paper copy in the mail If you have any lab test that is abnormal or we need to change your treatment, we will call you to review the results.   Testing/Procedures: Your physician has requested that you have an echocardiogram. Echocardiography is a painless test that uses sound waves to create images of your heart. It provides your doctor with information about the size and shape of your heart and how well your heart's chambers and valves are working. This procedure takes approximately one hour. There are no restrictions for this procedure.   Your physician has recommended that you wear an 14-DAY event monitor. Event monitors are medical devices that record the heart's electrical activity. Doctors most often [place] these monitors to diagnose arrhythmias. Arrhythmias are problems with the speed or rhythm of the heartbeat. The monitor is a small, portable device. You can wear one while you do your normal daily activities. This is usually used to diagnose what is causing palpitations/syncope (passing out).   Follow-Up: At [facility], you and your health needs are our priority.  As part of our continuing mission to provide you with exceptional heart care, we have created designated Provider Care Teams.  These Care Teams include your primary Cardiologist (physician) and Advanced Practice Providers (APPs -  Physician Assistants and Nurse Practitioners) who all work together to provide you with the care you need, when you need it.  We recommend  signing up for the patient portal called "MyChart".  Sign up information is provided on this After Visit Summary.  MyChart is used to connect with patients for Virtual Visits (Telemedicine).  Patients are able to view lab/test results, encounter notes, upcoming appointments, etc.  Non-urgent messages can be sent to your provider as well.   To learn more about what you can do with MyChart, go to [email or web address].    Your next appointment:   AS NEEDED   The format for your next appointment:   In Person  Provider:   Charlton Haws, MD   Other Instructions Echocardiogram An echocardiogram is a test that uses sound waves (ultrasound) to produce images of the heart. Images from an echocardiogram can provide important information about: Heart size and shape. The size and thickness and movement of your heart's walls. Heart muscle function and strength. Heart valve function or if you have stenosis. Stenosis is when the heart valves are too narrow. If blood is flowing backward through the heart valves (regurgitation). A tumor or infectious growth around the heart valves. Areas of heart muscle that are not working well because of poor blood flow or injury from a heart attack. Aneurysm detection. An aneurysm is a weak or damaged part of an artery wall. The wall bulges out from the normal force of blood pumping through the body. Tell a health care provider about: Any allergies you have. All medicines you are taking, including vitamins, herbs, eye drops, creams, and over-the-counter medicines. Any blood disorders you have. Any surgeries you have had. Any medical conditions you have. Whether you are pregnant or may be pregnant.  What are the risks? Generally, this is a safe test. However, problems may occur, including an allergic reaction to dye (contrast) that may be used during the test. What happens before the test? No specific preparation is needed. You may eat and drink normally. What  happens during the test?  You will take off your clothes from the waist up and put on a hospital gown. Electrodes or electrocardiogram (ECG)patches may be placed on your chest. The electrodes or patches are then connected to a device that monitors your heart rate and rhythm. You will lie down on a table for an ultrasound exam. A gel will be applied to your chest to help sound waves pass through your skin. A handheld device, called a transducer, will be pressed against your chest and moved over your heart. The transducer produces sound waves that travel to your heart and bounce back (or "echo" back) to the transducer. These sound waves will be captured in real-time and changed into images of your heart that can be viewed on a video monitor. The images will be recorded on a computer and reviewed by your health care provider. You may be asked to change positions or hold your breath for a short time. This makes it easier to get different views or better views of your heart. In some cases, you may receive contrast through an IV in one of your veins. This can improve the quality of the pictures from your heart. The procedure may vary among health care providers and hospitals. What can I expect after the test? You may return to your normal, everyday life, including diet, activities, andmedicines, unless your health care provider tells you not to do that. Follow these instructions at home: It is up to you to get the results of your test. Ask your health care provider, or the department that is doing the test, when your results will be ready. Keep all follow-up visits. This is important. Summary An echocardiogram is a test that uses sound waves (ultrasound) to produce images of the heart. Images from an echocardiogram can provide important information about the size and shape of your heart, heart muscle function, heart valve function, and other possible heart problems. You do not need to do anything to  prepare before this test. You may eat and drink normally. After the echocardiogram is completed, you may return to your normal, everyday life, unless your health care provider tells you not to do that. This information is not intended to replace advice given to you by your health care provider. Make sure you discuss any questions you have with your healthcare provider. Document Revised: 05/28/2020 Document Reviewed: 05/28/2020 Elsevier Patient Education  2022 [employer].

## 2021-04-05 DIAGNOSIS — S93602A Unspecified sprain of left foot, initial encounter: Secondary | ICD-10-CM | POA: Diagnosis not present

## 2021-04-08 DIAGNOSIS — R002 Palpitations: Secondary | ICD-10-CM

## 2021-04-12 ENCOUNTER — Ambulatory Visit: Payer: BC Managed Care – PPO | Admitting: Podiatry

## 2021-04-30 ENCOUNTER — Other Ambulatory Visit: Payer: Self-pay

## 2021-04-30 ENCOUNTER — Ambulatory Visit (HOSPITAL_COMMUNITY): Payer: 59 | Attending: Cardiology

## 2021-04-30 DIAGNOSIS — R0600 Dyspnea, unspecified: Secondary | ICD-10-CM | POA: Insufficient documentation

## 2021-04-30 DIAGNOSIS — R0609 Other forms of dyspnea: Secondary | ICD-10-CM

## 2021-04-30 DIAGNOSIS — R002 Palpitations: Secondary | ICD-10-CM | POA: Insufficient documentation

## 2021-04-30 LAB — ECHOCARDIOGRAM COMPLETE
Area-P 1/2: 3.58 cm2
S' Lateral: 4 cm

## 2021-04-30 MED ORDER — PERFLUTREN LIPID MICROSPHERE
3.0000 mL | INTRAVENOUS | Status: AC | PRN
  Administered 2021-04-30: 3 mL via INTRAVENOUS

## 2021-05-01 DIAGNOSIS — R002 Palpitations: Secondary | ICD-10-CM | POA: Diagnosis not present

## 2021-05-02 ENCOUNTER — Telehealth: Payer: Self-pay

## 2021-05-02 DIAGNOSIS — R9431 Abnormal electrocardiogram [ECG] [EKG]: Secondary | ICD-10-CM

## 2021-05-02 DIAGNOSIS — R002 Palpitations: Secondary | ICD-10-CM

## 2021-05-02 DIAGNOSIS — R5383 Other fatigue: Secondary | ICD-10-CM

## 2021-05-02 NOTE — Telephone Encounter (Signed)
This encounter was created in error - please disregard.

## 2021-05-02 NOTE — Telephone Encounter (Signed)
-----   Message from Wendall Stade, MD sent at 05/01/2021  5:55 PM EDT ----- ECG and echo were ok she had 7 beat run of NSVT Would order cardiac MRI r/o infiltrative DCM and RV dysplasia

## 2021-05-02 NOTE — Telephone Encounter (Signed)
Left message for patient to call back  

## 2021-05-02 NOTE — Telephone Encounter (Signed)
The patient has been notified of the result and verbalized understanding.  All questions (if any) were answered. [name], RN 05/02/2021 12:54 PM   Will place order for Cardiac MRI.

## 2021-05-06 ENCOUNTER — Other Ambulatory Visit (HOSPITAL_COMMUNITY): Payer: Self-pay | Admitting: Endocrinology

## 2021-05-06 DIAGNOSIS — M7989 Other specified soft tissue disorders: Secondary | ICD-10-CM

## 2021-05-08 ENCOUNTER — Other Ambulatory Visit: Payer: Self-pay

## 2021-05-08 ENCOUNTER — Ambulatory Visit (HOSPITAL_COMMUNITY)
Admission: RE | Admit: 2021-05-08 | Discharge: 2021-05-08 | Disposition: A | Discharge status: Home / Self Care | Payer: 59 | Source: Ambulatory Visit | Attending: Endocrinology | Admitting: Endocrinology

## 2021-05-08 DIAGNOSIS — M7989 Other specified soft tissue disorders: Secondary | ICD-10-CM | POA: Diagnosis not present

## 2021-06-12 ENCOUNTER — Telehealth: Payer: Self-pay

## 2021-06-12 ENCOUNTER — Other Ambulatory Visit: Payer: 59 | Admitting: *Deleted

## 2021-06-12 ENCOUNTER — Other Ambulatory Visit: Payer: Self-pay

## 2021-06-12 DIAGNOSIS — R002 Palpitations: Secondary | ICD-10-CM

## 2021-06-12 LAB — CBC
Hematocrit: 38 % (ref 34.0–46.6)
Hemoglobin: 12.6 g/dL (ref 11.1–15.9)
MCH: 27.1 pg (ref 26.6–33.0)
MCHC: 33.2 g/dL (ref 31.5–35.7)
MCV: 82 fL (ref 79–97)
Platelets: 325 10*3/uL (ref 150–450)
RBC: 4.65 x10E6/uL (ref 3.77–5.28)
RDW: 15.4 % (ref 11.7–15.4)
WBC: 7.4 10*3/uL (ref 3.4–10.8)

## 2021-06-12 NOTE — Telephone Encounter (Signed)
Pt will come by the office today for blood work.

## 2021-06-12 NOTE — Telephone Encounter (Signed)
This encounter was created in error - please disregard.

## 2021-06-12 NOTE — Telephone Encounter (Deleted)
-----   Message from Dorette Grate, RN sent at 06/12/2021  1:17 PM EDT ----- Regarding: CBC for cMRI Hi Pam / [employer] team,  Can we have the patient come in for a CBC prior to his cardiac MRI schedule for Wednesday Aug 31?   Thanks, Christeen Douglas

## 2021-06-12 NOTE — Telephone Encounter (Signed)
-----   Message from Merle C Prescott, RN sent at 06/12/2021  1:17 PM EDT ----- Regarding: CBC for cMRI Hi Pam / [address] team,  Can we have the patient come in for a CBC prior to his cardiac MRI schedule for Wednesday Aug 31?   Thanks, Merle  

## 2021-06-17 ENCOUNTER — Telehealth (HOSPITAL_COMMUNITY): Payer: Self-pay | Admitting: *Deleted

## 2021-06-17 NOTE — Telephone Encounter (Signed)
Reaching out to patient to offer assistance regarding upcoming cardiac imaging study; pt verbalizes understanding of appt date/time, parking situation and where to check in, and verified current allergies; name and call back number provided for further questions should they arise  Larey Brick RN Navigator Cardiac Imaging [facility] [phone] office [phone] cell  Patient denies claustrophobia.

## 2021-06-18 ENCOUNTER — Ambulatory Visit (HOSPITAL_COMMUNITY)
Admission: RE | Admit: 2021-06-18 | Discharge: 2021-06-18 | Disposition: A | Discharge status: Home / Self Care | Payer: 59 | Source: Ambulatory Visit | Attending: Cardiovascular Disease | Admitting: Cardiovascular Disease

## 2021-06-18 ENCOUNTER — Other Ambulatory Visit: Payer: Self-pay

## 2021-06-18 DIAGNOSIS — R9431 Abnormal electrocardiogram [ECG] [EKG]: Secondary | ICD-10-CM | POA: Insufficient documentation

## 2021-06-18 DIAGNOSIS — R5383 Other fatigue: Secondary | ICD-10-CM | POA: Diagnosis not present

## 2021-06-18 DIAGNOSIS — R002 Palpitations: Secondary | ICD-10-CM | POA: Diagnosis not present

## 2021-06-18 MED ORDER — GADOBUTROL 1 MMOL/ML IV SOLN
10.0000 mL | Freq: Once | INTRAVENOUS | Status: AC | PRN
  Administered 2021-06-18: 10 mL via INTRAVENOUS

## 2021-07-21 ENCOUNTER — Ambulatory Visit: Payer: BC Managed Care – PPO | Admitting: Cardiovascular Disease

## 2022-03-10 ENCOUNTER — Encounter (HOSPITAL_BASED_OUTPATIENT_CLINIC_OR_DEPARTMENT_OTHER): Payer: Self-pay

## 2022-03-10 ENCOUNTER — Other Ambulatory Visit: Payer: Self-pay

## 2022-03-10 DIAGNOSIS — W268XXA Contact with other sharp object(s), not elsewhere classified, initial encounter: Secondary | ICD-10-CM | POA: Diagnosis not present

## 2022-03-10 DIAGNOSIS — Z79899 Other long term (current) drug therapy: Secondary | ICD-10-CM | POA: Insufficient documentation

## 2022-03-10 DIAGNOSIS — Z23 Encounter for immunization: Secondary | ICD-10-CM | POA: Insufficient documentation

## 2022-03-10 DIAGNOSIS — S51811A Laceration without foreign body of right forearm, initial encounter: Secondary | ICD-10-CM | POA: Diagnosis not present

## 2022-03-10 DIAGNOSIS — E039 Hypothyroidism, unspecified: Secondary | ICD-10-CM | POA: Diagnosis not present

## 2022-03-10 DIAGNOSIS — S59911A Unspecified injury of right forearm, initial encounter: Secondary | ICD-10-CM | POA: Diagnosis present

## 2022-03-10 NOTE — ED Triage Notes (Signed)
Patient here POV from Home.  Endorses being Outside at Home when she ran her Arm accidentally across a Rusty Screw causing to 3-4 cm Laceration to Right Anterior Forearm. Adipose Tissue Exposed with Bleeding Controlled. Bandage Applied in Triage.  Tetanus 5-6 Years Ago.  NAD Noted during Triage. A&Ox4. GCS 15. Ambulatory.

## 2022-03-11 ENCOUNTER — Emergency Department (HOSPITAL_BASED_OUTPATIENT_CLINIC_OR_DEPARTMENT_OTHER)
Admission: EM | Admit: 2022-03-11 | Discharge: 2022-03-11 | Disposition: A | Discharge status: Home / Self Care | Payer: 59 | Attending: Emergency Medicine | Admitting: Emergency Medicine

## 2022-03-11 DIAGNOSIS — S51811A Laceration without foreign body of right forearm, initial encounter: Secondary | ICD-10-CM

## 2022-03-11 MED ORDER — TETANUS-DIPHTH-ACELL PERTUSSIS 5-2.5-18.5 LF-MCG/0.5 IM SUSY
0.5000 mL | PREFILLED_SYRINGE | Freq: Once | INTRAMUSCULAR | Status: AC
  Administered 2022-03-11: 0.5 mL via INTRAMUSCULAR
  Filled 2022-03-11: qty 0.5

## 2022-03-11 MED ORDER — LIDOCAINE-EPINEPHRINE (PF) 2 %-1:200000 IJ SOLN
20.0000 mL | Freq: Once | INTRAMUSCULAR | Status: AC
  Administered 2022-03-11: 20 mL via INTRADERMAL
  Filled 2022-03-11: qty 20

## 2022-03-11 NOTE — ED Provider Notes (Addendum)
[facility] EMERGENCY DEPT Provider Note  CSN: [id number] Arrival date & time: 03/10/22 2222  Chief Complaint(s) Laceration  HPI Theresa Anderson is a 36 y.o. female here for laceration to the right forearm.  She reports catching it on a screw tip that was sticking out of the fence post around 7 PM.  Bleeding was controlled she sustained proximal 1-1/2 to 2 cm laceration.  Tetanus was last given greater than 5 years ago.  She has associated aching around the laceration that is mild to moderate and worse with palpation.  They report irrigating it with tap water prior to arrival.   Laceration  Past Medical History Past Medical History:  Diagnosis Date   Hypothyroidism 02/10/2017   PAC (premature atrial contraction) 02/10/2017   Palpitations 02/10/2017   Patient Active Problem List   Diagnosis Date Noted   Acute pansinusitis 10/06/2019   Acute pharyngitis 10/06/2019   Fever 10/06/2019   Streptococcal sore throat 10/06/2019   Hypothyroidism 02/10/2017   Palpitations 02/10/2017   PAC (premature atrial contraction) 02/10/2017   Headache(784.0) 02/05/2016   Acne 12/12/2015   Dyspareunia in female 12/12/2015   Influenza vaccine refused 12/12/2015   Morbid obesity with BMI of 45.0-49.9, adult (HCC) 12/12/2015   Home Medication(s) Prior to Admission medications   Medication Sig Start Date End Date Taking? Authorizing Provider  levothyroxine (SYNTHROID, LEVOTHROID) 100 MCG tablet Take 100 mcg by mouth daily before breakfast.    [provider]  methocarbamol (ROBAXIN) 500 MG tablet  08/05/18   [provider]  nabumetone (RELAFEN) 750 MG tablet TAKE 1 TABLET(750 MG) BY MOUTH TWICE DAILY AS NEEDED 12/11/19   Kathryne Hitch, MD  propranolol (INDERAL) 10 MG tablet Take 1 tablet (10 mg total) by mouth daily. Please schedule appt for future refills. 2nd attempt. 05/23/20   Wendall Stade, MD                                                                                                                                     Allergies Amoxicillin  Review of Systems Review of Systems As noted in HPI  Physical Exam Vital Signs  I have reviewed the triage vital signs BP (!) 147/106 (BP Location: Left Arm)   Pulse (!) 52   Temp 98.6 F (37 C)   Resp 18   Ht 5\' 6"  (1.676 m)   Wt (!) 149.7 kg   SpO2 93%   BMI 53.27 kg/m   Physical Exam Vitals reviewed.  Constitutional:      General: She is not in acute distress.    Appearance: She is well-developed. She is obese. She is not diaphoretic.  HENT:     Head: Normocephalic and atraumatic.     Right Ear: External ear normal.     Left Ear: External ear normal.     Nose: Nose normal.  Eyes:     General: No scleral icterus.  Conjunctiva/sclera: Conjunctivae normal.  Neck:     Trachea: Phonation normal.  Cardiovascular:     Rate and Rhythm: Normal rate and regular rhythm.  Pulmonary:     Effort: Pulmonary effort is normal. No respiratory distress.     Breath sounds: No stridor.  Abdominal:     General: There is no distension.  Musculoskeletal:        General: Normal range of motion.     Right forearm: Laceration (V-shaped laceration to right forearm. approx 2 cm. adipose exposed. no FB noted.) present.     Cervical back: Normal range of motion.  Neurological:     Mental Status: She is alert and oriented to person, place, and time.  Psychiatric:        Behavior: Behavior normal.    ED Results and Treatments Labs (all labs ordered are listed, but only abnormal results are displayed) Labs Reviewed - No data to display                                                                                                                       EKG  EKG Interpretation  Date/Time:    Ventricular Rate:    PR Interval:    QRS Duration:   QT Interval:    QTC Calculation:   R Axis:     Text Interpretation:         Radiology No results found.  Pertinent labs & imaging results that  were available during my care of the patient were reviewed by me and considered in my medical decision making (see MDM for details).  Medications Ordered in ED Medications  lidocaine-EPINEPHrine (XYLOCAINE W/EPI) 2 %-1:200000 (PF) injection 20 mL (20 mLs Intradermal Given 03/11/22 0057)  Tdap (BOOSTRIX) injection 0.5 mL (0.5 mLs Intramuscular Given 03/11/22 0054)                                                                                                                                     Procedures .Marland Kitchen.Laceration Repair  Date/Time: 03/11/2022 12:41 AM Performed by: Nira Connardama, Lisaann Atha Eduardo, MD Authorized by: Nira Connardama, Laiana Fratus Eduardo, MD   Consent:    Consent obtained:  Verbal   Consent given by:  Patient   Risks discussed:  Infection, need for additional repair, poor cosmetic result and poor wound healing   Alternatives discussed:  No treatment and delayed treatment Universal protocol:    Procedure explained and questions  answered to patient or proxy's satisfaction: yes     Patient identity confirmed:  Arm band Anesthesia:    Anesthesia method:  Local infiltration   Local anesthetic:  Lidocaine 2% WITH epi Laceration details:    Location:  Shoulder/arm   Shoulder/arm location:  R lower arm   Length (cm):  2   Depth (mm):  7 Pre-procedure details:    Preparation:  Patient was prepped and draped in usual sterile fashion Exploration:    Hemostasis achieved with:  Direct pressure   Wound extent: no fascia violation noted, no foreign bodies/material noted, no muscle damage noted and no tendon damage noted     Contaminated: yes   Treatment:    Area cleansed with:  Povidone-iodine   Amount of cleaning:  Extensive   Irrigation solution:  Sterile saline   Irrigation volume:  500cc   Irrigation method:  Pressure wash   Debridement:  None Skin repair:    Repair method:  Sutures   Suture size:  4-0   Suture material:  Nylon   Suture technique:  Vertical mattress and horizontal  mattress   Number of sutures:  3 Approximation:    Approximation:  Close Repair type:    Repair type:  Intermediate Post-procedure details:    Dressing:  Antibiotic ointment   Procedure completion:  Tolerated  (including critical care time)  Medical Decision Making / ED Course    Complexity of Problem:  Patient's presenting problem/concern, DDX, and MDM listed below: Laceration Soft tissue injury. Doubt deep wound or FB requiring imaging at this time      ED Course:    Assessment, Add'l Intervention, and Reassessment: Laceration Thoroughly irrigated and closed as above Tetanus updated   Final Clinical Impression(s) / ED Diagnoses Final diagnoses:  Laceration of right forearm, initial encounter   The patient appears reasonably screened and/or stabilized for discharge and I doubt any other medical condition or other [facility] requiring further screening, evaluation, or treatment in the ED at this time prior to discharge. Safe for discharge with strict return precautions.  Disposition: Discharge  Condition: Good  I have discussed the results, Dx and Tx plan with the patient/family who expressed understanding and agree(s) with the plan. Discharge instructions discussed at length. The patient/family was given strict return precautions who verbalized understanding of the instructions. No further questions at time of discharge.    ED Discharge Orders     None       Follow Up: [facility] [address] [place] [address] [phone] Go to  for suture removal in 10-14 days           This chart was dictated using voice recognition software.  Despite best efforts to proofread,  errors can occur which can change the documentation meaning.      Nira Conn, MD 03/11/22 [phone]

## 2022-03-11 NOTE — Discharge Instructions (Signed)
Do not let your laceration (cut) get wet for the next 48 hours. After that you may allow soapy water to drain down the wound to clean it. Please do not scrub.  To minimize scarring, you can apply a vaseline based ointment for the next 2 weeks and keep it out of direct sun light. After that, you may apply sunscreen for the next several months. Your stitches will need to be taken out in 10-14 days.  Return if your wound appears to be infected (see laceration care instructions).

## 2022-09-19 ENCOUNTER — Other Ambulatory Visit: Payer: Self-pay | Admitting: Cardiovascular Disease

## 2022-09-21 ENCOUNTER — Telehealth: Payer: Self-pay

## 2022-09-21 MED ORDER — PROPRANOLOL HCL 10 MG PO TABS: 10.0000 mg | ORAL_TABLET | Freq: Every day | ORAL | 0 refills | Status: AC

## 2022-09-21 NOTE — Telephone Encounter (Signed)
Called and left message for patient to call back. Will make patient appointment to follow-up, over due for 6 month follow-up after MRI.

## 2024-06-30 ENCOUNTER — Encounter: Admitting: Family Medicine
